# Patient Record
Sex: Female | Born: 1935 | Race: White | Hispanic: No | Smoking: Former smoker
Health system: Southern US, Community
[De-identification: ages and names within clinical notes are randomized; demographics above are authoritative.]

## PROBLEM LIST (undated history)

## (undated) DIAGNOSIS — I499 Cardiac arrhythmia, unspecified: Secondary | ICD-10-CM

## (undated) DIAGNOSIS — M1990 Unspecified osteoarthritis, unspecified site: Secondary | ICD-10-CM

## (undated) DIAGNOSIS — K222 Esophageal obstruction: Secondary | ICD-10-CM

## (undated) DIAGNOSIS — K219 Gastro-esophageal reflux disease without esophagitis: Secondary | ICD-10-CM

## (undated) DIAGNOSIS — K449 Diaphragmatic hernia without obstruction or gangrene: Secondary | ICD-10-CM

## (undated) DIAGNOSIS — E785 Hyperlipidemia, unspecified: Secondary | ICD-10-CM

## (undated) HISTORY — DX: Gastro-esophageal reflux disease without esophagitis: K21.9

## (undated) HISTORY — DX: Diaphragmatic hernia without obstruction or gangrene: K44.9

## (undated) HISTORY — PX: ABDOMINAL HYSTERECTOMY: SHX81

## (undated) HISTORY — PX: EYE SURGERY: SHX253

## (undated) HISTORY — DX: Esophageal obstruction: K22.2

## (undated) HISTORY — DX: Cardiac arrhythmia, unspecified: I49.9

## (undated) HISTORY — DX: Hyperlipidemia, unspecified: E78.5

---

## 2004-10-10 ENCOUNTER — Ambulatory Visit: Payer: Self-pay | Admitting: Gastroenterology

## 2004-10-16 ENCOUNTER — Ambulatory Visit: Payer: Self-pay | Admitting: Gastroenterology

## 2004-10-16 ENCOUNTER — Ambulatory Visit (HOSPITAL_COMMUNITY): Admission: RE | Admit: 2004-10-16 | Discharge: 2004-10-16 | Payer: Self-pay | Admitting: Gastroenterology

## 2005-01-23 ENCOUNTER — Ambulatory Visit: Payer: Self-pay | Admitting: Internal Medicine

## 2006-02-11 ENCOUNTER — Ambulatory Visit: Payer: Self-pay | Admitting: Internal Medicine

## 2007-03-10 ENCOUNTER — Ambulatory Visit: Payer: Self-pay | Admitting: Internal Medicine

## 2008-03-16 ENCOUNTER — Ambulatory Visit: Payer: Self-pay | Admitting: Internal Medicine

## 2009-03-28 ENCOUNTER — Ambulatory Visit: Payer: Self-pay | Admitting: Internal Medicine

## 2010-03-29 ENCOUNTER — Ambulatory Visit: Payer: Self-pay | Admitting: Internal Medicine

## 2011-04-02 ENCOUNTER — Ambulatory Visit: Payer: Self-pay | Admitting: Internal Medicine

## 2012-04-07 ENCOUNTER — Ambulatory Visit: Payer: Self-pay | Admitting: Internal Medicine

## 2012-05-07 ENCOUNTER — Encounter: Payer: Self-pay | Admitting: Gastroenterology

## 2012-05-21 ENCOUNTER — Encounter: Payer: Self-pay | Admitting: Gastroenterology

## 2012-05-21 ENCOUNTER — Ambulatory Visit (INDEPENDENT_AMBULATORY_CARE_PROVIDER_SITE_OTHER): Payer: Medicare Other | Admitting: Gastroenterology

## 2012-05-21 VITALS — BP 138/84 | HR 100 | Ht 65.0 in | Wt 172.0 lb

## 2012-05-21 DIAGNOSIS — K219 Gastro-esophageal reflux disease without esophagitis: Secondary | ICD-10-CM

## 2012-05-21 DIAGNOSIS — R1319 Other dysphagia: Secondary | ICD-10-CM

## 2012-05-21 NOTE — Progress Notes (Signed)
History of Present Illness: This is a 78 year old female with a history of GERD and a peptic stricture. She complains of intermittent dysphagia to solids, particularly breads, for one year. Her symptoms have not been progressive. She underwent upper endoscopy by Dr. Victorino Dike in 1999 with dilation of an esophagogastric junction stricture. She takes omeprazole 20 mg daily which has generally been helpful in controlling her reflux symptoms although she notes frequent breakthrough symptoms and regurgitation with recumbency. She underwent colonoscopy in 2006 which was completed the hepatic flexure and a subsequent air-contrast barium enema was unremarkable. Denies weight loss, abdominal pain, constipation, diarrhea, change in stool caliber, melena, hematochezia, nausea, vomiting, and chest pain.  Review of Systems: Pertinent positive and negative review of systems were noted in the above HPI section. All other review of systems were otherwise negative.  Current Medications, Allergies, Past Medical History, Past Surgical History, Family History and Social History were reviewed in [employer] record.  Physical Exam: General: Well developed , well nourished, no acute distress Head: Normocephalic and atraumatic Eyes:  sclerae anicteric, EOMI Ears: Normal auditory acuity Mouth: No deformity or lesions Neck: Supple, no masses or thyromegaly Lungs: Clear throughout to auscultation Heart: Regular rate and rhythm; no murmurs, rubs or bruits Abdomen: Soft, non tender and non distended. No masses, hepatosplenomegaly or hernias noted. Normal Bowel sounds Musculoskeletal: Symmetrical with no gross deformities  Skin: No lesions on visible extremities Pulses:  Normal pulses noted Extremities: No clubbing, cyanosis, edema or deformities noted Neurological: Alert oriented x 4, grossly nonfocal Cervical Nodes:  No significant cervical adenopathy Inguinal Nodes: No significant inguinal  adenopathy Psychological:  Alert and cooperative. Normal mood and affect  Assessment and Recommendations:  1. Dysphagia and GERD. Presumably she has a recurrent esophageal stricture. Standard antireflux measures and continue omeprazole 20 mg daily. May need to increase omeprazole to 40 mg daily or 20 mg twice a day pending findings at endoscopy. Schedule upper endoscopy. The risks, benefits, and alternatives to endoscopy with possible biopsy and possible dilation were discussed with the patient and they consent to proceed.   2. Colorectal cancer screening, routine risk. She is due for screening colonoscopy in 2016. Given her prior difficulties with colonoscopy consider a virtual colonoscopy or air-contrast barium enema.

## 2012-05-21 NOTE — Patient Instructions (Addendum)
You have been scheduled for an endoscopy with propofol. Please follow written instructions given to you at your visit today. If you use inhalers (even only as needed), please bring them with you on the day of your procedure.  Patient advised to avoid spicy, acidic, citrus, chocolate, mints, fruit and fruit juices.  Limit the intake of caffeine, alcohol and Soda.  Don't exercise too soon after eating.  Don't lie down within 3-4 hours of eating.  Elevate the head of your bed.  CC: Shannon Hendrix

## 2012-06-19 ENCOUNTER — Ambulatory Visit (AMBULATORY_SURGERY_CENTER): Payer: Medicare Other | Admitting: Gastroenterology

## 2012-06-19 ENCOUNTER — Encounter: Payer: Self-pay | Admitting: Gastroenterology

## 2012-06-19 VITALS — BP 131/80 | HR 99 | Temp 98.3°F | Resp 20 | Ht 65.0 in | Wt 172.0 lb

## 2012-06-19 DIAGNOSIS — K219 Gastro-esophageal reflux disease without esophagitis: Secondary | ICD-10-CM

## 2012-06-19 DIAGNOSIS — K222 Esophageal obstruction: Secondary | ICD-10-CM

## 2012-06-19 DIAGNOSIS — R1319 Other dysphagia: Secondary | ICD-10-CM

## 2012-06-19 MED ORDER — SODIUM CHLORIDE 0.9 % IV SOLN: 500.0000 mL | INTRAVENOUS | Status: DC

## 2012-06-19 NOTE — Progress Notes (Addendum)
INFORMED JOHN NULTY CRNA OF SA02 READINGS, STAYING 91-90%. JOHN NULTY CRNA IN TO SEE PATIENT . HEAD OF BED ELEVATED  ENCOURAGED COUGH AND DEEP BREATHING. PATIENT EXPECTORATED SMALL AMOUNT OF WHITE MUCOUS. PATIENT EXAMINED BY JOHN NULTY,CRNA AND DR. STARK, CLEARED FOR DISCHARGE WITH SA02 OF 99%, USING EAR PROBE.

## 2012-06-19 NOTE — Progress Notes (Signed)
Patient did not experience any of the following events: a burn prior to discharge; a fall within the facility; wrong site/side/patient/procedure/implant event; or a hospital transfer or hospital admission upon discharge from the facility. (G8907) Patient did not have preoperative order for IV antibiotic SSI prophylaxis. (G8918)  

## 2012-06-19 NOTE — Progress Notes (Signed)
Called to see patient for decreased (90-92%) SATS Appears comfortable RR 16-18, no c/o SOB skin tone pink, Lungs CTA  , silver nail polish, O2 sat probe place on ear and Sats increased to 95-97% Dr. Russella Dar informed

## 2012-06-19 NOTE — Op Note (Signed)
[facility] [address] [place], [address]   ENDOSCOPY PROCEDURE REPORT  PATIENT: Shannon, Hendrix  MR#: [id number] BIRTHDATE: 11/08/1933 , 77  yrs. old GENDER: Female ENDOSCOPIST: Meryl Dare, MD, [facility] PROCEDURE DATE:  06/19/2012 PROCEDURE:   EGD with dilatation over guidewire ASA CLASS:   Class II INDICATIONS:dysphagia and GERD. MEDICATIONS: MAC sedation, administered by CRNA and propofol (Diprivan) 200mg  IV TOPICAL ANESTHETIC:   Cetacaine Spray DESCRIPTION OF PROCEDURE:   After the risks benefits and alternatives of the procedure were thoroughly explained, informed consent was obtained.  The     endoscope was introduced through the mouth  and advanced to the descending duodenum ,      The instrument was slowly withdrawn as the mucosa was carefully examined.   ESOPHAGUS: A stricture was found at the gastroesophageal junction. The stenosis was traversable with the endoscope and it appeared benign.   The esophagus was otherwise normal. STOMACH: The mucosa and folds of the stomach appeared normal.   A small  hiatal hernia noted on retroflexed view. DUODENUM: The duodenal mucosa showed no abnormalities in the bulb and second portion of the duodenum. Dilation was then performed at the gastroesphageal junction: Dilator:Savary over guidewire Size:15, 16 and 17 mm Reststance:minimal Heme:none  COMPLICATIONS: There were no complications.  ENDOSCOPIC IMPRESSION: 1.   Stricture was found at the gastroesophageal junction 2.   Small hiatal hernia  RECOMMENDATIONS: 1.  anti-reflux regimen long term 2.  continue PPI long term 3.  post dilation instructions   eSigned:  Meryl Dare, MD, [facility] 06/19/2012 3:10 PM

## 2012-06-19 NOTE — Patient Instructions (Signed)
YOU HAD AN ENDOSCOPIC PROCEDURE TODAY AT THE [facility]: Refer to the procedure report that was given to you for any specific questions about what was found during the examination.  If the procedure report does not answer your questions, please call your gastroenterologist to clarify.  If you requested that your care partner not be given the details of your procedure findings, then the procedure report has been included in a sealed envelope for you to review at your convenience later.  YOU SHOULD EXPECT: Some feelings of bloating in the abdomen. Passage of more gas than usual.  Walking can help get rid of the air that was put into your GI tract during the procedure and reduce the bloating. If you had a lower endoscopy (such as a colonoscopy or flexible sigmoidoscopy) you may notice spotting of blood in your stool or on the toilet paper. If you underwent a bowel prep for your procedure, then you may not have a normal bowel movement for a few days.  DIET: NOTHING TO EAT OR DRINK UNTIL4:15 PM. 4:15 TO 5:15 ONLY CLEAR LIQUIDS. AFTER 5:15 SOFT FOODS UNTIL THE AM. RESUME YOUR DIET IN AM. ACTIVITY: Your care partner should take you home directly after the procedure.  You should plan to take it easy, moving slowly for the rest of the day.  You can resume normal activity the day after the procedure however you should NOT DRIVE or use heavy machinery for 24 hours (because of the sedation medicines used during the test).    SYMPTOMS TO REPORT IMMEDIATELY: A gastroenterologist can be reached at any hour.  During normal business hours, 8:30 AM to 5:00 PM Monday through Friday, call [phone].  After hours and on weekends, please call the GI answering service at [phone] who will take a message and have the physician on call contact you.   Following upper endoscopy (EGD)  Vomiting of blood or coffee ground material  New chest pain or pain under the shoulder blades  Painful or persistently  difficult swallowing  New shortness of breath  Fever of 100F or higher  Black, tarry-looking stools  FOLLOW UP: If any biopsies were taken you will be contacted by phone or by letter within the next 1-3 weeks.  Call your gastroenterologist if you have not heard about the biopsies in 3 weeks.  Our staff will call the home number listed on your records the next business day following your procedure to check on you and address any questions or concerns that you may have at that time regarding the information given to you following your procedure. This is a courtesy call and so if there is no answer at the home number and we have not heard from you through the emergency physician on call, we will assume that you have returned to your regular daily activities without incident.  SIGNATURES/CONFIDENTIALITY: You and/or your care partner have signed paperwork which will be entered into your electronic medical record.  These signatures attest to the fact that that the information above on your After Visit Summary has been reviewed and is understood.  Full responsibility of the confidentiality of this discharge information lies with you and/or your care-partner.

## 2012-06-22 ENCOUNTER — Telehealth: Payer: Self-pay | Admitting: *Deleted

## 2012-06-22 NOTE — Telephone Encounter (Signed)
  Follow up Call-  Call back number 06/19/2012  Post procedure Call Back phone  # [phone]  Permission to leave phone message Yes     Patient questions:  Do you have a fever, pain , or abdominal swelling? no Pain Score  0 *  Have you tolerated food without any problems? yes  Have you been able to return to your normal activities? yes  Do you have any questions about your discharge instructions: Diet   no Medications  no Follow up visit  no  Do you have questions or concerns about your Care? no  Actions: * If pain score is 4 or above: No action needed, pain <4.

## 2013-04-08 ENCOUNTER — Ambulatory Visit: Payer: Self-pay | Admitting: Internal Medicine

## 2013-05-04 ENCOUNTER — Encounter: Payer: Self-pay | Admitting: Podiatry

## 2013-05-04 ENCOUNTER — Ambulatory Visit (INDEPENDENT_AMBULATORY_CARE_PROVIDER_SITE_OTHER): Payer: Medicare Other | Admitting: Podiatry

## 2013-05-04 VITALS — BP 140/78 | HR 98 | Temp 97.7°F | Resp 16 | Ht 65.0 in | Wt 169.4 lb

## 2013-05-04 DIAGNOSIS — M722 Plantar fascial fibromatosis: Secondary | ICD-10-CM

## 2013-05-04 NOTE — Patient Instructions (Signed)
Call with any problems

## 2013-05-04 NOTE — Progress Notes (Signed)
Subjective:     Patient ID: Shannon Hendrix, female   DOB: 10-Sep-1933, 80 y.o.   MRN: [id number]  HPI patient states that her heel is feeling better. Patient has not resumed exercise at this time.   Review of Systems  All other systems reviewed and are negative.       Objective:   Physical Exam  Nursing note and vitals reviewed. Cardiovascular: Intact distal pulses.    The patient's left heel pain has reduced quite a bit when pressed. Mild discomfort still noted.    Assessment:     Plantar fasciitis left that is showing significant improvement.    Plan:     Educated patient on physical therapy types of shoe gear is that I think would be better and ice therapy. Patient may resume exercise gradually over the next 4 weeks and is encouraged to continue fascially brace. Reappointed symptoms start again

## 2013-05-14 ENCOUNTER — Encounter: Payer: Self-pay | Admitting: Podiatry

## 2013-05-14 ENCOUNTER — Ambulatory Visit (INDEPENDENT_AMBULATORY_CARE_PROVIDER_SITE_OTHER): Payer: Medicare Other | Admitting: Podiatry

## 2013-05-14 VITALS — BP 139/83 | HR 105 | Resp 16 | Ht 65.0 in | Wt 165.0 lb

## 2013-05-14 DIAGNOSIS — M722 Plantar fascial fibromatosis: Secondary | ICD-10-CM

## 2013-05-14 NOTE — Progress Notes (Signed)
Subjective:     Patient ID: Shannon Hendrix, female   DOB: [birth year], 79 y.o.   MRN: [id number]  HPI patient states I'm still having pain in my left foot but improved. She is walking greater distances but has pain when sitting or in the morning   Review of Systems  All other systems reviewed and are negative.       Objective:   Physical Exam  Nursing note and vitals reviewed. Constitutional: She appears well-developed and well-nourished.  Cardiovascular: Intact distal pulses.   Musculoskeletal: Normal range of motion.  Skin: Skin is warm.   Patient's heel is mildly tender but improved from previous visit patient continues to have discomfort with ambulation and after periods of sitting    Assessment:     Plantar fasciitis improving but still stubbornly present    Plan:     Advised on physical therapy and supportive shoe gear usage. Dispensed a night splint with instructions on usage

## 2013-05-14 NOTE — Patient Instructions (Signed)

## 2014-02-18 DIAGNOSIS — M169 Osteoarthritis of hip, unspecified: Secondary | ICD-10-CM | POA: Insufficient documentation

## 2014-04-11 ENCOUNTER — Ambulatory Visit: Payer: Self-pay | Admitting: Internal Medicine

## 2014-04-12 ENCOUNTER — Ambulatory Visit: Payer: Self-pay | Admitting: Internal Medicine

## 2014-04-18 ENCOUNTER — Ambulatory Visit: Payer: Self-pay | Admitting: Internal Medicine

## 2014-04-18 HISTORY — PX: BREAST BIOPSY: SHX20

## 2014-04-19 LAB — PATHOLOGY REPORT

## 2015-01-04 ENCOUNTER — Other Ambulatory Visit: Payer: Self-pay | Admitting: Internal Medicine

## 2015-01-04 DIAGNOSIS — Z1231 Encounter for screening mammogram for malignant neoplasm of breast: Secondary | ICD-10-CM

## 2015-01-04 DIAGNOSIS — N63 Unspecified lump in unspecified breast: Secondary | ICD-10-CM

## 2015-01-11 ENCOUNTER — Ambulatory Visit
Admission: RE | Admit: 2015-01-11 | Discharge: 2015-01-11 | Disposition: A | Discharge status: Home / Self Care | Payer: Medicare Other | Source: Ambulatory Visit | Attending: Internal Medicine | Admitting: Internal Medicine

## 2015-01-11 ENCOUNTER — Ambulatory Visit: Payer: Medicare Other

## 2015-01-11 DIAGNOSIS — N6489 Other specified disorders of breast: Secondary | ICD-10-CM | POA: Diagnosis not present

## 2015-01-11 DIAGNOSIS — N63 Unspecified lump in unspecified breast: Secondary | ICD-10-CM

## 2015-01-26 ENCOUNTER — Other Ambulatory Visit: Payer: Self-pay | Admitting: Internal Medicine

## 2015-01-26 DIAGNOSIS — D242 Benign neoplasm of left breast: Secondary | ICD-10-CM

## 2015-02-21 ENCOUNTER — Ambulatory Visit: Payer: Medicare Other | Admitting: Podiatry

## 2015-04-13 ENCOUNTER — Ambulatory Visit
Admission: RE | Admit: 2015-04-13 | Discharge: 2015-04-13 | Disposition: A | Discharge status: Home / Self Care | Payer: Medicare Other | Source: Ambulatory Visit | Attending: Internal Medicine | Admitting: Internal Medicine

## 2015-04-13 DIAGNOSIS — D242 Benign neoplasm of left breast: Secondary | ICD-10-CM

## 2015-04-13 DIAGNOSIS — N63 Unspecified lump in breast: Secondary | ICD-10-CM | POA: Diagnosis present

## 2016-01-10 ENCOUNTER — Other Ambulatory Visit: Payer: Self-pay | Admitting: Internal Medicine

## 2016-01-10 DIAGNOSIS — Z1231 Encounter for screening mammogram for malignant neoplasm of breast: Secondary | ICD-10-CM

## 2016-04-15 ENCOUNTER — Ambulatory Visit: Payer: Medicare Other

## 2016-04-17 ENCOUNTER — Other Ambulatory Visit: Payer: Self-pay | Admitting: Internal Medicine

## 2016-04-17 ENCOUNTER — Ambulatory Visit
Admission: RE | Admit: 2016-04-17 | Discharge: 2016-04-17 | Disposition: A | Discharge status: Home / Self Care | Payer: Medicare Other | Source: Ambulatory Visit | Attending: Internal Medicine | Admitting: Internal Medicine

## 2016-04-17 DIAGNOSIS — Z1231 Encounter for screening mammogram for malignant neoplasm of breast: Secondary | ICD-10-CM | POA: Diagnosis not present

## 2017-01-16 ENCOUNTER — Other Ambulatory Visit: Payer: Self-pay | Admitting: Internal Medicine

## 2017-01-16 DIAGNOSIS — Z1231 Encounter for screening mammogram for malignant neoplasm of breast: Secondary | ICD-10-CM

## 2017-01-19 ENCOUNTER — Encounter: Payer: Self-pay | Admitting: Emergency Medicine

## 2017-01-19 ENCOUNTER — Observation Stay
Admission: EM | Admit: 2017-01-19 | Discharge: 2017-01-20 | Disposition: A | Discharge status: Home / Self Care | Payer: Medicare Other | Attending: Internal Medicine | Admitting: Internal Medicine

## 2017-01-19 DIAGNOSIS — K625 Hemorrhage of anus and rectum: Principal | ICD-10-CM | POA: Diagnosis present

## 2017-01-19 DIAGNOSIS — Z7982 Long term (current) use of aspirin: Secondary | ICD-10-CM | POA: Diagnosis not present

## 2017-01-19 DIAGNOSIS — R000 Tachycardia, unspecified: Secondary | ICD-10-CM | POA: Diagnosis not present

## 2017-01-19 DIAGNOSIS — R197 Diarrhea, unspecified: Secondary | ICD-10-CM

## 2017-01-19 DIAGNOSIS — E785 Hyperlipidemia, unspecified: Secondary | ICD-10-CM | POA: Insufficient documentation

## 2017-01-19 DIAGNOSIS — Z79899 Other long term (current) drug therapy: Secondary | ICD-10-CM | POA: Diagnosis not present

## 2017-01-19 DIAGNOSIS — K219 Gastro-esophageal reflux disease without esophagitis: Secondary | ICD-10-CM | POA: Insufficient documentation

## 2017-01-19 DIAGNOSIS — Z87891 Personal history of nicotine dependence: Secondary | ICD-10-CM | POA: Diagnosis not present

## 2017-01-19 LAB — CBC
HCT: 45.4 % (ref 35.0–47.0)
HEMOGLOBIN: 15.6 g/dL (ref 12.0–16.0)
MCH: 30.8 pg (ref 26.0–34.0)
MCHC: 34.4 g/dL (ref 32.0–36.0)
MCV: 89.6 fL (ref 80.0–100.0)
PLATELETS: 237 10*3/uL (ref 150–440)
RBC: 5.07 MIL/uL (ref 3.80–5.20)
RDW: 12.8 % (ref 11.5–14.5)
WBC: 15.5 10*3/uL — AB (ref 3.6–11.0)

## 2017-01-19 LAB — COMPREHENSIVE METABOLIC PANEL
ALK PHOS: 92 U/L (ref 38–126)
ALT: 36 U/L (ref 14–54)
ANION GAP: 7 (ref 5–15)
AST: 38 U/L (ref 15–41)
Albumin: 4.2 g/dL (ref 3.5–5.0)
BUN: 11 mg/dL (ref 6–20)
CALCIUM: 9.3 mg/dL (ref 8.9–10.3)
CO2: 26 mmol/L (ref 22–32)
CREATININE: 1.02 mg/dL — AB (ref 0.44–1.00)
Chloride: 104 mmol/L (ref 101–111)
GFR calc non Af Amer: 51 mL/min — ABNORMAL LOW (ref >60)
GFR, EST AFRICAN AMERICAN: 59 mL/min — AB (ref >60)
Glucose, Bld: 145 mg/dL — ABNORMAL HIGH (ref 65–99)
Potassium: 4.1 mmol/L (ref 3.5–5.1)
SODIUM: 137 mmol/L (ref 135–145)
Total Bilirubin: 0.7 mg/dL (ref 0.3–1.2)
Total Protein: 7.5 g/dL (ref 6.5–8.1)

## 2017-01-19 LAB — TYPE AND SCREEN
ABO/RH(D): O POS
ANTIBODY SCREEN: NEGATIVE

## 2017-01-19 LAB — PROTIME-INR
INR: 0.93
PROTHROMBIN TIME: 12.5 s (ref 11.4–15.2)

## 2017-01-19 LAB — GASTROINTESTINAL PANEL BY PCR, STOOL (REPLACES STOOL CULTURE)

## 2017-01-19 LAB — C DIFFICILE QUICK SCREEN W PCR REFLEX
C DIFFICILE (CDIFF) TOXIN: NEGATIVE
C DIFFICLE (CDIFF) ANTIGEN: NEGATIVE
C Diff interpretation: NOT DETECTED

## 2017-01-19 MED ORDER — PANTOPRAZOLE SODIUM 40 MG PO TBEC
40.0000 mg | DELAYED_RELEASE_TABLET | Freq: Every day | ORAL | Status: DC
  Administered 2017-01-19 – 2017-01-20 (×2): 40 mg via ORAL
  Filled 2017-01-19 (×2): qty 1

## 2017-01-19 MED ORDER — ONDANSETRON HCL 4 MG/2ML IJ SOLN: 4.0000 mg | Freq: Four times a day (QID) | INTRAMUSCULAR | Status: DC | PRN

## 2017-01-19 MED ORDER — OMEGA-3-ACID ETHYL ESTERS 1 G PO CAPS
1.0000 g | ORAL_CAPSULE | Freq: Every day | ORAL | Status: DC
  Administered 2017-01-20: 1 g via ORAL
  Filled 2017-01-19: qty 1

## 2017-01-19 MED ORDER — ONDANSETRON HCL 4 MG PO TABS: 4.0000 mg | ORAL_TABLET | Freq: Four times a day (QID) | ORAL | Status: DC | PRN

## 2017-01-19 MED ORDER — ACETAMINOPHEN 650 MG RE SUPP: 650.0000 mg | Freq: Four times a day (QID) | RECTAL | Status: DC | PRN

## 2017-01-19 MED ORDER — AMITRIPTYLINE HCL 50 MG PO TABS
100.0000 mg | ORAL_TABLET | Freq: Every day | ORAL | Status: DC
  Filled 2017-01-19: qty 1

## 2017-01-19 MED ORDER — AMITRIPTYLINE HCL 50 MG PO TABS
50.0000 mg | ORAL_TABLET | Freq: Every day | ORAL | Status: DC
  Filled 2017-01-19 (×2): qty 1

## 2017-01-19 MED ORDER — ACETAMINOPHEN 325 MG PO TABS: 650.0000 mg | ORAL_TABLET | Freq: Four times a day (QID) | ORAL | Status: DC | PRN

## 2017-01-19 MED ORDER — ADULT MULTIVITAMIN W/MINERALS CH
1.0000 | ORAL_TABLET | Freq: Every day | ORAL | Status: DC
  Administered 2017-01-20: 1 via ORAL
  Filled 2017-01-19 (×2): qty 1

## 2017-01-19 MED ORDER — METOPROLOL TARTRATE 25 MG PO TABS
25.0000 mg | ORAL_TABLET | Freq: Two times a day (BID) | ORAL | Status: DC
  Administered 2017-01-19 – 2017-01-20 (×2): 25 mg via ORAL
  Filled 2017-01-19 (×2): qty 1

## 2017-01-19 MED ORDER — SODIUM CHLORIDE 0.9 % IV BOLUS (SEPSIS)
500.0000 mL | Freq: Once | INTRAVENOUS | Status: AC
Start: 2017-01-19 — End: 2017-01-19
  Administered 2017-01-19: 500 mL via INTRAVENOUS

## 2017-01-19 MED ORDER — PRAVASTATIN SODIUM 20 MG PO TABS
20.0000 mg | ORAL_TABLET | Freq: Every day | ORAL | Status: DC
  Administered 2017-01-19: 20 mg via ORAL
  Filled 2017-01-19: qty 1

## 2017-01-19 MED ORDER — VITAMIN D 1000 UNITS PO TABS
2000.0000 [IU] | ORAL_TABLET | Freq: Every day | ORAL | Status: DC
  Administered 2017-01-20: 2000 [IU] via ORAL
  Filled 2017-01-19: qty 2

## 2017-01-19 NOTE — ED Triage Notes (Signed)
Pt with diarrhea started last night, states this am with bright red blood coming from her rectum x1. No   Pain.

## 2017-01-19 NOTE — ED Notes (Signed)
Pt back in bed, placed back on cardiac monitor, call bell in reach and family at bedside.

## 2017-01-19 NOTE — H&P (Signed)
[place] [facility] [place] NAME: Shannon Hendrix    MR#:  [id number]  DATE OF BIRTH:  [birth year]  DATE OF ADMISSION:  01/19/2017  PRIMARY CARE PHYSICIAN: Adin Hector, MD   REQUESTING/REFERRING PHYSICIAN: Dr Burlene Arnt  CHIEF COMPLAINT:   Past bright red blood per rectum 2 HISTORY OF PRESENT ILLNESS:  Shannon Hendrix  is a 83 y.o. female with a known history of GERD, hyperlipidemia comes to the emergency room after she had bright red blood per rectum 2 since yesterday. Patient said she had started with diarrhea which was brown looking and then had to bowel movements with bright red blood. She quantitates about teaspoonful of blood. Denies any abdominal pain and denies any history of diverticular disease or known history of hemorrhoids. She does have intermittent constipation. She was somewhat tachycardic in the ER. She felt dizzy and hence internal medicine was consulted for rectal bleed and further evaluation  Patient had attempted colonoscopy 2003 were record states her colon was very tortuous and could not do the procedure.  PAST MEDICAL HISTORY:   Past Medical History:  Diagnosis Date  . Cardiac arrhythmia   . Esophageal stricture   . GERD (gastroesophageal reflux disease)   . Hiatal hernia   . HLD (hyperlipidemia)     PAST SURGICAL HISTOIRY:   Past Surgical History:  Procedure Laterality Date  . ABDOMINAL HYSTERECTOMY    . BREAST BIOPSY Left 04/18/14   negative  . EYE SURGERY Bilateral     SOCIAL HISTORY:   Social History  Substance Use Topics  . Smoking status: Former Smoker    Packs/day: 0.30    Years: 5.00    Types: Cigarettes    Quit date: 07/29/1961  . Smokeless tobacco: Never Used  . Alcohol use No    FAMILY HISTORY:   Family History  Problem Relation Age of Onset  . Bone cancer Sister   . Heart attack Brother     DRUG ALLERGIES:  No Known Allergies  REVIEW OF SYSTEMS:  Review of Systems   Constitutional: Negative for chills, fever and weight loss.  HENT: Negative for ear discharge, ear pain and nosebleeds.   Eyes: Negative for blurred vision, pain and discharge.  Respiratory: Negative for sputum production, shortness of breath, wheezing and stridor.   Cardiovascular: Negative for chest pain, palpitations, orthopnea and PND.  Gastrointestinal: Positive for blood in stool. Negative for abdominal pain, diarrhea, nausea and vomiting.  Genitourinary: Negative for frequency and urgency.  Musculoskeletal: Negative for back pain and joint pain.  Neurological: Positive for weakness. Negative for sensory change, speech change and focal weakness.  Psychiatric/Behavioral: Negative for depression and hallucinations. The patient is not nervous/anxious.      MEDICATIONS AT HOME:   Prior to Admission medications   Medication Sig Start Date End Date Taking? Authorizing Provider  amitriptyline (ELAVIL) 100 MG tablet Take 100 mg by mouth at bedtime.    Yes [provider]  aspirin 81 MG tablet Take 81 mg by mouth daily.   Yes [provider]  Cholecalciferol (VITAMIN D-3) 1000 UNITS CAPS Take 2 capsules by mouth daily.    Yes [provider]  lovastatin (MEVACOR) 20 MG tablet Take 20 mg by mouth at bedtime.   Yes [provider]  metoprolol tartrate (LOPRESSOR) 25 MG tablet Take 25 mg by mouth 2 (two) times daily.   Yes [provider]  Multiple Vitamin (MULTIVITAMIN) tablet Take 1 tablet by mouth daily.  Yes [provider]  Omega-3 Fatty Acids (FISH OIL) 1000 MG CAPS Take 2 capsules by mouth 2 (two) times daily.    Yes [provider]  omeprazole (PRILOSEC) 20 MG capsule Take 20 mg by mouth daily.   Yes [provider]      VITAL SIGNS:  Blood pressure 138/79, pulse (!) 119, temperature 98.4 F (36.9 C), temperature source Oral, resp. rate 18, weight 74.8 kg (165 lb), SpO2 94 %.  PHYSICAL EXAMINATION:  GENERAL:   83 y.o.-year-old patient lying in the bed with no acute distress.  EYES: Pupils equal, round, reactive to light and accommodation. No scleral icterus. Extraocular muscles intact.  HEENT: Head atraumatic, normocephalic. Oropharynx and nasopharynx clear.  NECK:  Supple, no jugular venous distention. No thyroid enlargement, no tenderness.  LUNGS: Normal breath sounds bilaterally, no wheezing, rales,rhonchi or crepitation. No use of accessory muscles of respiration.  CARDIOVASCULAR: S1, S2 normal. No murmurs, rubs, or gallops.  ABDOMEN: Soft, nontender, nondistended. Bowel sounds present. No organomegaly or mass.  EXTREMITIES: No pedal edema, cyanosis, or clubbing.  NEUROLOGIC: Cranial nerves II through XII are intact. Muscle strength 5/5 in all extremities. Sensation intact. Gait not checked.  PSYCHIATRIC: The patient is alert and oriented x 3.  SKIN: No obvious rash, lesion, or ulcer.   LABORATORY PANEL:   CBC  Recent Labs Lab 01/19/17 0823  WBC 15.5*  HGB 15.6  HCT 45.4  PLT 237   ------------------------------------------------------------------------------------------------------------------  Chemistries   Recent Labs Lab 01/19/17 0823  NA 137  K 4.1  CL 104  CO2 26  GLUCOSE 145*  BUN 11  CREATININE 1.02*  CALCIUM 9.3  AST 38  ALT 36  ALKPHOS 92  BILITOT 0.7   ------------------------------------------------------------------------------------------------------------------  Cardiac Enzymes No results for input(s): TROPONINI in the last 168 hours. ------------------------------------------------------------------------------------------------------------------  RADIOLOGY:  No results found.  EKG:   Sinus tachycardia IMPRESSION AND PLAN:  Shannon Hendrix  is a 83 y.o. female with a known history of GERD, hyperlipidemia comes to the emergency room after she had bright red blood per rectum 2 since yesterday. Patient said she had started with diarrhea which was  brown looking and then had to bowel movements with bright red blood.  1. Rectal bleed -Suspect internal hemorrhoids versus diverticular -Hemoglobin stable vital stable other than mild tachycardia -Monitor H&H -GI consultation -Continue PPI for GERD -Hold aspirin  2. Mild tachycardia secondary to 1  3. GERD continue PPI  4. Hyperlipidemia continue lovastatin  5. DVT prophylaxis SCD and teds in the setting of rectal bleed  Patient was informed she is being admitted for night observation. Discussed with family in the ER    All the records are reviewed and case discussed with ED provider. Management plans discussed with the patient, family and they are in agreement.  CODE STATUS: *Full*  TOTAL TIME TAKING CARE OF THIS PATIENT: *40* minutes.    Luke Falero M.D on 01/19/2017 at 11:12 AM  Between 7am to 6pm - Pager - [phone]  After 6pm go to [email or web address] - password [email or web address] [facility]  [facility]  Office  [phone]  CC: Primary care physician; Adin Hector, MD

## 2017-01-19 NOTE — ED Notes (Signed)
Rodman Key, RN called to transport pt to the floor

## 2017-01-19 NOTE — Consult Note (Signed)
Consultation  Referring Provider:      Primary Care Physician:  Adin Hector, MD Primary Gastroenterologist:         Reason for Consultation:     Hematochezia     Impression / Plan:   Hematochezia: Probable hemorrhoidal after bout of explosive diarrhea. GI panel negative. Diarrhea resolved. HGB stable. Differential also includes ischemic colitis (elevated WBC) or malignancy. Patient prefers to avoid colonoscopy at this time. If stops bleeding in 24 hrs she can be discharged with follow up consultation with gastroenterologist. If continues bleeding needs colonoscopy on Tuesday.           HPI:   Shannon Hendrix is a 83 y.o. female with a known history of GERD, hyperlipidemia comes to the emergency room after she had bright red blood per rectum 2 since yesterday. Patient said she had started with diarrhea which was brown looking and then had to bowel movements with bright red blood. She quantitates about teaspoonful of blood. Denies any abdominal pain and denies any history of diverticular disease or known history of hemorrhoids. She does have intermittent constipation. Last colonoscopy in 2006. Repeat CBC is pending.  Patient did not want to do bowel prep at this time if could be avoided.   Past Medical History:  Diagnosis Date  . Cardiac arrhythmia   . Esophageal stricture   . GERD (gastroesophageal reflux disease)   . Hiatal hernia   . HLD (hyperlipidemia)     Past Surgical History:  Procedure Laterality Date  . ABDOMINAL HYSTERECTOMY    . BREAST BIOPSY Left 04/18/14   negative  . EYE SURGERY Bilateral     Family History  Problem Relation Age of Onset  . Bone cancer Sister   . Heart attack Brother       Social History  Substance Use Topics  . Smoking status: Former Smoker    Packs/day: 0.30    Years: 5.00    Types: Cigarettes    Quit date: 07/29/1961  . Smokeless tobacco: Never Used  . Alcohol use No    Prior to Admission medications   Medication Sig Start  Date End Date Taking? Authorizing Provider  amitriptyline (ELAVIL) 100 MG tablet Take 100 mg by mouth at bedtime.    Yes [provider]  aspirin 81 MG tablet Take 81 mg by mouth daily.   Yes [provider]  Cholecalciferol (VITAMIN D-3) 1000 UNITS CAPS Take 2 capsules by mouth daily.    Yes [provider]  lovastatin (MEVACOR) 20 MG tablet Take 20 mg by mouth at bedtime.   Yes [provider]  metoprolol tartrate (LOPRESSOR) 25 MG tablet Take 25 mg by mouth 2 (two) times daily.   Yes [provider]  Multiple Vitamin (MULTIVITAMIN) tablet Take 1 tablet by mouth daily.   Yes [provider]  Omega-3 Fatty Acids (FISH OIL) 1000 MG CAPS Take 2 capsules by mouth 2 (two) times daily.    Yes [provider]  omeprazole (PRILOSEC) 20 MG capsule Take 20 mg by mouth daily.   Yes [provider]    Current Facility-Administered Medications  Medication Dose Route Frequency Provider Last Rate Last Dose  . acetaminophen (TYLENOL) tablet 650 mg  650 mg Oral Q6H PRN Fritzi Mandes, MD       Or  . acetaminophen (TYLENOL) suppository 650 mg  650 mg Rectal Q6H PRN Fritzi Mandes, MD      . amitriptyline (ELAVIL) tablet 100 mg  100 mg  Oral Johnna Acosta, MD      . Derrill Memo ON 01/20/2017] cholecalciferol (VITAMIN D) tablet 2,000 Units  2,000 Units Oral Daily Fritzi Mandes, MD      . metoprolol tartrate (LOPRESSOR) tablet 25 mg  25 mg Oral BID Fritzi Mandes, MD      . Derrill Memo ON 01/20/2017] multivitamin with minerals tablet 1 tablet  1 tablet Oral Daily Fritzi Mandes, MD      . Derrill Memo ON 01/20/2017] omega-3 acid ethyl esters (LOVAZA) capsule 1 g  1 g Oral Daily Fritzi Mandes, MD      . ondansetron [facility]) tablet 4 mg  4 mg Oral Q6H PRN Fritzi Mandes, MD       Or  . ondansetron (ZOFRAN) injection 4 mg  4 mg Intravenous Q6H PRN Fritzi Mandes, MD      . pantoprazole (PROTONIX) EC tablet 40 mg  40 mg Oral Daily Fritzi Mandes, MD      . pravastatin (PRAVACHOL)  tablet 20 mg  20 mg Oral [id number] Fritzi Mandes, MD        Allergies as of 01/19/2017  . (No Known Allergies)     Review of Systems:    This is positive for those things mentioned in the HPI,       Physical Exam:  Vital signs in last 24 hours: Temp:  [98.3 F (36.8 C)-98.4 F (36.9 C)] 98.3 F (36.8 C) (06/24 1225) Pulse Rate:  [70-119] 70 (06/24 1225) Resp:  [15-27] 16 (06/24 1225) BP: (123-139)/(61-90) 132/78 (06/24 1225) SpO2:  [93 %-97 %] 97 % (06/24 1225) Weight:  [74.6 kg (164 lb 8 oz)-74.8 kg (165 lb)] 74.6 kg (164 lb 8 oz) (06/24 1225) Last BM Date: 01/19/17  General:  Well-developed, well-nourished and in no acute distress Eyes:  anicteric. ENT:   Mouth and posterior pharynx free of lesions.  Neck:   supple w/o thyromegaly or mass.  Lungs: Clear to auscultation bilaterally. Heart:   S1S2, no rubs, murmurs, gallops. Abdomen:  soft, non-tender, no hepatosplenomegaly, hernia, or mass and BS+.  Rectal: Lymph:  no cervical or supraclavicular adenopathy. Extremities:   no edema Skin   no rash. Neuro:  A&O x 3.  Psych:  appropriate mood and  Affect.   Data Reviewed:   LAB RESULTS:  Recent Labs  01/19/17 0823  WBC 15.5*  HGB 15.6  HCT 45.4  PLT 237   BMET  Recent Labs  01/19/17 0823  NA 137  K 4.1  CL 104  CO2 26  GLUCOSE 145*  BUN 11  CREATININE 1.02*  CALCIUM 9.3   LFT  Recent Labs  01/19/17 0823  PROT 7.5  ALBUMIN 4.2  AST 38  ALT 36  ALKPHOS 92  BILITOT 0.7   PT/INR  Recent Labs  01/19/17 0917  LABPROT 12.5  INR 0.93    STUDIES: No results found.   PREVIOUS ENDOSCOPIES:                Thanks   LOS: 0 days   San Jetty MD @  01/19/2017, 2:27 PM

## 2017-01-19 NOTE — ED Notes (Signed)
Patient ambulatory to the restroom. Pt urinated, blood present on toilet paper from wiping rectum.

## 2017-01-19 NOTE — ED Notes (Signed)
Pt unhooked from cardiac monitor and assisted to bathroom.

## 2017-01-19 NOTE — ED Provider Notes (Signed)
[facility] Emergency Department Provider Note  ____________________________________________   I have reviewed the triage vital signs and the nursing notes.   HISTORY  Chief Complaint Rectal Bleeding and Diarrhea    HPI Shannon Hendrix is a 83 y.o. female who is on aspirin but no other blood thinners, no history of GI bleeding does have a history of recurrent diarrhea has had diarrhea over the last 12 hours but now is having blood. She is having bright blood per rectum. She denies abdominal pain. She does not feel very lightheaded. She denies fever or chills. Denies chest pain or shortness of breath. Patient states her heart rate is always somewhat elevated, it was 118 in triage, at this time it is 105. She did not take her metoprolol swelling. She denies any abdominal pain of any variety. She denies any antibiotics or recent travel. Initially, it was brown non-melanotic diarrhea. She has not vomited. Now dishes bloody diarrhea. She has not had a fever. No recent travel, no known sick contacts with similar, last colonoscopy was 10 years ago, no known history of ever diverticular disease. No history of hemorrhoids and she has no rectal pain. She has had now to these bright red bloody stools.     Past Medical History:  Diagnosis Date  . Cardiac arrhythmia   . Esophageal stricture   . GERD (gastroesophageal reflux disease)   . Hiatal hernia   . HLD (hyperlipidemia)     There are no active problems to display for this patient.   Past Surgical History:  Procedure Laterality Date  . ABDOMINAL HYSTERECTOMY    . BREAST BIOPSY Left 04/18/14   negative  . EYE SURGERY Bilateral     Prior to Admission medications   Medication Sig Start Date End Date Taking? Authorizing Provider  amitriptyline (ELAVIL) 50 MG tablet Take 50 mg by mouth at bedtime.    [provider]  aspirin 81 MG tablet Take 81 mg by mouth daily.    [provider]  atenolol  (TENORMIN) 25 MG tablet Take 25 mg by mouth daily.    [provider]  Cholecalciferol (VITAMIN D-3) 1000 UNITS CAPS Take 2 capsules by mouth daily.     [provider]  lovastatin (MEVACOR) 20 MG tablet Take 20 mg by mouth at bedtime.    [provider]  Omega-3 Fatty Acids (FISH OIL) 1000 MG CAPS Take 2 capsules by mouth 3 (three) times daily.    [provider]  omeprazole (PRILOSEC) 20 MG capsule Take 20 mg by mouth daily.    [provider]    Allergies Patient has no known allergies.  Family History  Problem Relation Age of Onset  . Bone cancer Sister   . Heart attack Brother     Social History Social History  Substance Use Topics  . Smoking status: Former Smoker    Packs/day: 0.30    Years: 5.00    Types: Cigarettes    Quit date: 07/29/1961  . Smokeless tobacco: Never Used  . Alcohol use No    Review of Systems Constitutional: No fever/chills Eyes: No visual changes. ENT: No sore throat. No stiff neck no neck pain Cardiovascular: Denies chest pain. Respiratory: Denies shortness of breath. Gastrointestinal:   no vomiting.  Positive diarrhea.  No constipation. Genitourinary: Negative for dysuria. Musculoskeletal: Negative lower extremity swelling Skin: Negative for rash. Neurological: Negative for severe headaches, focal weakness or numbness.   ____________________________________________   PHYSICAL EXAM:  VITAL SIGNS:  ED Triage Vitals  Enc Vitals Group     BP 01/19/17 0807 138/79     Pulse Rate 01/19/17 0807 (!) 119     Resp 01/19/17 0807 18     Temp 01/19/17 0807 98.4 F (36.9 C)     Temp Source 01/19/17 0807 Oral     SpO2 01/19/17 0807 94 %     Weight 01/19/17 0812 165 lb (74.8 kg)     Height --      Head Circumference --      Peak Flow --      Pain Score --      Pain Loc --      Pain Edu? --      Excl. in [place]? --     Constitutional: Alert and oriented. Well appearing and in no acute distress. Eyes:  Conjunctivae are normal Head: Atraumatic HEENT: No congestion/rhinnorhea. Mucous membranes are moist.  Oropharynx non-erythematous Neck:   Nontender with no meningismus, no masses, no stridor Cardiovascular: Slight tachycardia noted regular rhythm. Grossly normal heart sounds.  Good peripheral circulation. Respiratory: Normal respiratory effort.  No retractions. Lungs CTAB. Abdominal: Soft and nontender. No distention. No guarding no rebound Rectal: Levada Dy, female nurse chaperone present, brown and guaiac positive blood-tinged stool Back:  There is no focal tenderness or step off.  there is no midline tenderness there are no lesions noted. there is no CVA tenderness Musculoskeletal: No lower extremity tenderness, no upper extremity tenderness. No joint effusions, no DVT signs strong distal pulses no edema Neurologic:  Normal speech and language. No gross focal neurologic deficits are appreciated.  Skin:  Skin is warm, dry and intact. No rash noted. Psychiatric: Mood and affect are normal. Speech and behavior are normal.  ____________________________________________   LABS (all labs ordered are listed, but only abnormal results are displayed)  Labs Reviewed  COMPREHENSIVE METABOLIC PANEL - Abnormal; Notable for the following:       Result Value   Glucose, Bld 145 (*)    Creatinine, Ser 1.02 (*)    GFR calc non Af Amer 51 (*)    GFR calc Af Amer 59 (*)    All other components within normal limits  CBC - Abnormal; Notable for the following:    WBC 15.5 (*)    All other components within normal limits  C DIFFICILE QUICK SCREEN W PCR REFLEX  GASTROINTESTINAL PANEL BY PCR, STOOL (REPLACES STOOL CULTURE)  PROTIME-INR  POC OCCULT BLOOD, ED  TYPE AND SCREEN   ____________________________________________  EKG  I personally interpreted any EKGs ordered by me or triage Sinus tachycardia rate 111 bpm no acute ST elevation or depression, no acute ischemic  changes ____________________________________________  RADIOLOGY  I reviewed any imaging ordered by me or triage that were performed during my shift and, if possible, patient and/or family made aware of any abnormal findings. ____________________________________________   PROCEDURES  Procedure(s) performed: None  Procedures  Critical Care performed: CRITICAL CARE Performed by: Schuyler Amor   Total critical care time: 42 minutes  Critical care time was exclusive of separately billable procedures and treating other patients.  Critical care was necessary to treat or prevent imminent or life-threatening deterioration.  Critical care was time spent personally by me on the following activities: development of treatment plan with patient and/or surrogate as well as nursing, discussions with consultants, evaluation of patient's response to treatment, examination of patient, obtaining history from patient or surrogate, ordering and performing treatments and interventions, ordering and review of laboratory  studies, ordering and review of radiographic studies, pulse oximetry and re-evaluation of patient's condition.   ____________________________________________   INITIAL IMPRESSION / ASSESSMENT AND PLAN / ED COURSE  Pertinent labs & imaging results that were available during my care of the patient were reviewed by me and considered in my medical decision making (see chart for details).  Patient here with rectal bleed. She has mild tachycardic this certainly could be multifactorial as she is somewhat anxious as well. However she did have another bloody bowel movement here. She clearly is guaiac positive. Hemoglobin initially is reassuring but that certainly is not sufficient in a patient with aspirin and recurrent red blood from rectum to establish that she is safe to go home. I feel patient would be better served with an admission. Her abdomen is completely benign with no tenderness and  don't think imaging would likely give [place] a diagnosis. We will admit to the hospitalist service.    ____________________________________________   FINAL CLINICAL IMPRESSION(S) / ED DIAGNOSES  Final diagnoses:  Diarrhea, unspecified type  Rectal bleeding      This chart was dictated using voice recognition software.  Despite best efforts to proofread,  errors can occur which can change meaning.      Schuyler Amor, MD 01/19/17 1034

## 2017-01-20 DIAGNOSIS — K625 Hemorrhage of anus and rectum: Secondary | ICD-10-CM

## 2017-01-20 LAB — CBC
HEMATOCRIT: 41.4 % (ref 35.0–47.0)
HEMOGLOBIN: 14.3 g/dL (ref 12.0–16.0)
MCH: 31.4 pg (ref 26.0–34.0)
MCHC: 34.5 g/dL (ref 32.0–36.0)
MCV: 90.9 fL (ref 80.0–100.0)
Platelets: 202 10*3/uL (ref 150–440)
RBC: 4.55 MIL/uL (ref 3.80–5.20)
RDW: 12.9 % (ref 11.5–14.5)
WBC: 8.8 10*3/uL (ref 3.6–11.0)

## 2017-01-20 LAB — HEMOGLOBIN: Hemoglobin: 14.6 g/dL (ref 12.0–16.0)

## 2017-01-20 MED ORDER — PEG 3350-KCL-NA BICARB-NACL 420 G PO SOLR
4000.0000 mL | Freq: Once | ORAL | Status: AC
  Administered 2017-01-20: 4000 mL via ORAL
  Filled 2017-01-20: qty 4000

## 2017-01-20 NOTE — Progress Notes (Signed)
Shannon Bellows MD, MRCP(U.K) [address]  [place]  [place], [place] [address]  Main: [place] is being followed for rectal bleeding  Day 2 of follow up   Subjective: No further bowel movent since yesterday , no diarrhea    Objective: Vital signs in last 24 hours: Vitals:   01/19/17 2046 01/20/17 0529 01/20/17 0536 01/20/17 0938  BP: 121/78 99/67 109/73 (!) 144/82  Pulse: (!) 108 100 (!) 104 (!) 107  Resp: 16 16    Temp: 98.5 F (36.9 C) 98.8 F (37.1 C)    TempSrc: Oral Oral    SpO2: 94% 92% 96% 96%  Weight:      Height:       Weight change:   Intake/Output Summary (Last 24 hours) at 01/20/17 0955 Last data filed at 01/20/17 0908  Gross per 24 hour  Intake              480 ml  Output             1100 ml  Net             -620 ml     Exam: Heart:: Regular rate and rhythm, S1S2 present or without murmur or extra heart sounds Lungs: normal, clear to auscultation and clear to auscultation and percussion Abdomen: soft, nontender, normal bowel sounds   Lab Results: CBC Latest Ref Rng & Units 01/20/2017 01/19/2017  WBC 3.6 - 11.0 K/uL 8.8 15.5(H)  Hemoglobin 12.0 - 16.0 g/dL 14.3 15.6  Hematocrit 35.0 - 47.0 % 41.4 45.4  Platelets 150 - 440 K/uL 202 237   Hepatic Function Latest Ref Rng & Units 01/19/2017  Total Protein 6.5 - 8.1 g/dL 7.5  Albumin 3.5 - 5.0 g/dL 4.2  AST 15 - 41 U/L 38  ALT 14 - 54 U/L 36  Alk Phosphatase 38 - 126 U/L 92  Total Bilirubin 0.3 - 1.2 mg/dL 0.7   BMP Latest Ref Rng & Units 01/19/2017  Glucose 65 - 99 mg/dL 145(H)  BUN 6 - 20 mg/dL 11  Creatinine 0.44 - 1.00 mg/dL 1.02(H)  Sodium 135 - 145 mmol/L 137  Potassium 3.5 - 5.1 mmol/L 4.1  Chloride 101 - 111 mmol/L 104  CO2 22 - 32 mmol/L 26  Calcium 8.9 - 10.3 mg/dL 9.3    Micro Results: Recent Results (from the past 240 hour(s))  Gastrointestinal Panel by PCR , Stool     Status: None   Collection Time: 01/19/17  9:17 AM  Result Value Ref Range  Status   Campylobacter species NOT DETECTED NOT DETECTED Final   Plesimonas shigelloides NOT DETECTED NOT DETECTED Final   Salmonella species NOT DETECTED NOT DETECTED Final   Yersinia enterocolitica NOT DETECTED NOT DETECTED Final   Vibrio species NOT DETECTED NOT DETECTED Final   Vibrio cholerae NOT DETECTED NOT DETECTED Final   Enteroaggregative E coli (EAEC) NOT DETECTED NOT DETECTED Final   Enteropathogenic E coli (EPEC) NOT DETECTED NOT DETECTED Final   Enterotoxigenic E coli (ETEC) NOT DETECTED NOT DETECTED Final   Shiga like toxin producing E coli (STEC) NOT DETECTED NOT DETECTED Final   Shigella/Enteroinvasive E coli (EIEC) NOT DETECTED NOT DETECTED Final   Cryptosporidium NOT DETECTED NOT DETECTED Final   Cyclospora cayetanensis NOT DETECTED NOT DETECTED Final   Entamoeba histolytica NOT DETECTED NOT DETECTED Final   Giardia lamblia NOT DETECTED NOT DETECTED Final   Adenovirus F40/41 NOT DETECTED NOT DETECTED Final  Astrovirus NOT DETECTED NOT DETECTED Final   Norovirus GI/GII NOT DETECTED NOT DETECTED Final   Rotavirus A NOT DETECTED NOT DETECTED Final   Sapovirus (I, II, IV, and V) NOT DETECTED NOT DETECTED Final  C difficile quick scan w PCR reflex     Status: None   Collection Time: 01/19/17  9:17 AM  Result Value Ref Range Status   C Diff antigen NEGATIVE NEGATIVE Final   C Diff toxin NEGATIVE NEGATIVE Final   C Diff interpretation No C. difficile detected.  Final   Studies/Results: No results found. Medications: I have reviewed the patient's current medications. Scheduled Meds: . amitriptyline  50 mg Oral QHS  . cholecalciferol  2,000 Units Oral Daily  . metoprolol tartrate  25 mg Oral BID  . multivitamin with minerals  1 tablet Oral Daily  . omega-3 acid ethyl esters  1 g Oral Daily  . pantoprazole  40 mg Oral Daily  . polyethylene glycol-electrolytes  4,000 mL Oral Once  . pravastatin  20 mg Oral q1800   Continuous Infusions: PRN Meds:.acetaminophen **OR**  acetaminophen, ondansetron **OR** ondansetron (ZOFRAN) IV   Assessment: Active Problems:   Rectal bleed  Shannon Hendrix 83 y.o. female admitted with BRBPR for 1 day duration preceded by diarrhea . No bowel movements since yesterday    Plan: 1. Hb stable- can take a few glasses of golytely and if clear can go home with outpatient follow up if still has blood then plan for colonoscopy tomorrow.     LOS: 0 days   Shannon Hendrix 01/20/2017, 9:55 AM

## 2017-01-20 NOTE — Discharge Summary (Signed)
[facility] NAME: Shannon Hendrix    MR#:  [id number]  DATE OF BIRTH:  [birth year]  DATE OF ADMISSION:  01/19/2017 ADMITTING PHYSICIAN: Fritzi Mandes, MD  DATE OF DISCHARGE: 01/20/2017  PRIMARY CARE PHYSICIAN: Adin Hector, MD    ADMISSION DIAGNOSIS:  Rectal bleeding [K62.5] Diarrhea, unspecified type [R19.7]  DISCHARGE DIAGNOSIS:  Active Problems:   Rectal bleed   SECONDARY DIAGNOSIS:   Past Medical History:  Diagnosis Date  . Cardiac arrhythmia   . Esophageal stricture   . GERD (gastroesophageal reflux disease)   . Hiatal hernia   . HLD (hyperlipidemia)     HOSPITAL COURSE:   1. Rectal bleeding. Patient states she had lots of diarrhea and then started seeing bright red blood dripping out. No pain in the abdomen. When I saw her she had no further bleeding but did not have any further bowel movements. Case discussed with Dr. Vicente Males gastroenterology and we gave her couple cups of GoLYTELY. She did have a bowel movement without blood. Since her hemoglobin was stable and no further bleeding she was discharged home. Stop aspirin. Her last colonoscopy was back in 2006. Most likely the bleeding is rectal irritation versus hemorrhoidal. 2. Tachycardia has a history of this and is on metoprolol 3. GERD on PPI 4. Hyperlipidemia on lovastatin  DISCHARGE CONDITIONS:   Satisfactory  CONSULTS OBTAINED:  Treatment Team:  San Jetty, MD [name], MD  DRUG ALLERGIES:  No Known Allergies  DISCHARGE MEDICATIONS:   Current Discharge Medication List    CONTINUE these medications which have NOT CHANGED   Details  amitriptyline (ELAVIL) 100 MG tablet Take 100 mg by mouth at bedtime.     Cholecalciferol (VITAMIN D-3) 1000 UNITS CAPS Take 2 capsules by mouth daily.     lovastatin (MEVACOR) 20 MG tablet Take 20 mg by mouth at bedtime.    metoprolol tartrate (LOPRESSOR) 25 MG tablet Take 25 mg by mouth 2 (two) times daily.    Multiple Vitamin (MULTIVITAMIN) tablet Take 1 tablet by mouth daily.    Omega-3 Fatty Acids (FISH OIL) 1000 MG CAPS Take 2 capsules by mouth 2 (two) times daily.     omeprazole (PRILOSEC) 20 MG capsule Take 20 mg by mouth daily.      STOP taking these medications     aspirin 81 MG tablet          DISCHARGE INSTRUCTIONS:   Follow-up with PMD one week Follow-up gastroenterology 2 weeks.  If you experience worsening of your admission symptoms, develop shortness of breath, life threatening emergency, suicidal or homicidal thoughts you must seek medical attention immediately by calling 911 or calling your MD immediately  if symptoms less severe.  You Must read complete instructions/literature along with all the possible adverse reactions/side effects for all the Medicines you take and that have been prescribed to you. Take any new Medicines after you have completely understood and accept all the possible adverse reactions/side effects.   Please note  You were cared for by a hospitalist during your hospital stay. If you have any questions about your discharge medications or the care you received while you were in the hospital after you are discharged, you can call the unit and asked to speak with the hospitalist on call if the hospitalist that took care of you is not available. Once you are discharged, your primary care physician will handle any further medical issues. Please note that NO REFILLS for any  discharge medications will be authorized once you are discharged, as it is imperative that you return to your primary care physician (or establish a relationship with a primary care physician if you do not have one) for your aftercare needs so that they can reassess your need for medications and monitor your lab values.    Today   CHIEF COMPLAINT:   Chief Complaint  Patient presents with  . Rectal Bleeding  . Diarrhea    HISTORY OF PRESENT ILLNESS:  Shannon Hendrix  is a 83 y.o.  female presented after diarrhea and had rectal bleeding   VITAL SIGNS:  Blood pressure (!) 146/97, pulse 84, temperature 98.4 F (36.9 C), temperature source Oral, resp. rate 18, height 5\' 5"  (1.651 m), weight 74.6 kg (164 lb 8 oz), SpO2 99 %.    PHYSICAL EXAMINATION:  GENERAL:  83 y.o.-year-old patient lying in the bed with no acute distress.  EYES: Pupils equal, round, reactive to light and accommodation. No scleral icterus. Extraocular muscles intact.  HEENT: Head atraumatic, normocephalic. Oropharynx and nasopharynx clear.  NECK:  Supple, no jugular venous distention. No thyroid enlargement, no tenderness.  LUNGS: Normal breath sounds bilaterally, no wheezing, rales,rhonchi or crepitation. No use of accessory muscles of respiration.  CARDIOVASCULAR: S1, S2 normal. No murmurs, rubs, or gallops.  ABDOMEN: Soft, non-tender, non-distended. Bowel sounds present. No organomegaly or mass.  EXTREMITIES: No pedal edema, cyanosis, or clubbing.  NEUROLOGIC: Cranial nerves II through XII are intact. Muscle strength 5/5 in all extremities. Sensation intact. Gait not checked.  PSYCHIATRIC: The patient is alert and oriented x 3.  SKIN: No obvious rash, lesion, or ulcer.   DATA REVIEW:   CBC  Recent Labs Lab 01/20/17 0513 01/20/17 1139  WBC 8.8  --   HGB 14.3 14.6  HCT 41.4  --   PLT 202  --     Chemistries   Recent Labs Lab 01/19/17 0823  NA 137  K 4.1  CL 104  CO2 26  GLUCOSE 145*  BUN 11  CREATININE 1.02*  CALCIUM 9.3  AST 38  ALT 36  ALKPHOS 92  BILITOT 0.7     Microbiology Results  Results for orders placed or performed during the hospital encounter of 01/19/17  Gastrointestinal Panel by PCR , Stool     Status: None   Collection Time: 01/19/17  9:17 AM  Result Value Ref Range Status   Campylobacter species NOT DETECTED NOT DETECTED Final   Plesimonas shigelloides NOT DETECTED NOT DETECTED Final   Salmonella species NOT DETECTED NOT DETECTED Final   Yersinia  enterocolitica NOT DETECTED NOT DETECTED Final   Vibrio species NOT DETECTED NOT DETECTED Final   Vibrio cholerae NOT DETECTED NOT DETECTED Final   Enteroaggregative E coli (EAEC) NOT DETECTED NOT DETECTED Final   Enteropathogenic E coli (EPEC) NOT DETECTED NOT DETECTED Final   Enterotoxigenic E coli (ETEC) NOT DETECTED NOT DETECTED Final   Shiga like toxin producing E coli (STEC) NOT DETECTED NOT DETECTED Final   Shigella/Enteroinvasive E coli (EIEC) NOT DETECTED NOT DETECTED Final   Cryptosporidium NOT DETECTED NOT DETECTED Final   Cyclospora cayetanensis NOT DETECTED NOT DETECTED Final   Entamoeba histolytica NOT DETECTED NOT DETECTED Final   Giardia lamblia NOT DETECTED NOT DETECTED Final   Adenovirus F40/41 NOT DETECTED NOT DETECTED Final   Astrovirus NOT DETECTED NOT DETECTED Final   Norovirus GI/GII NOT DETECTED NOT DETECTED Final   Rotavirus A NOT DETECTED NOT DETECTED Final   Sapovirus (I, II, IV,  and V) NOT DETECTED NOT DETECTED Final  C difficile quick scan w PCR reflex     Status: None   Collection Time: 01/19/17  9:17 AM  Result Value Ref Range Status   C Diff antigen NEGATIVE NEGATIVE Final   C Diff toxin NEGATIVE NEGATIVE Final   C Diff interpretation No C. difficile detected.  Final      Management plans discussed with the patient, family and they are in agreement.  CODE STATUS:     Code Status Orders        Start     Ordered   01/19/17 1232  Full code  Continuous     01/19/17 1231    Code Status History    Date Active Date Inactive Code Status Order ID Comments User Context   This patient has a current code status but no historical code status.    Advance Directive Documentation     Most Recent Value  Type of Advance Directive  Healthcare Power of Attorney, Living will  Pre-existing out of facility DNR order (yellow form or pink MOST form)  -  "MOST" Form in Place?  -      TOTAL TIME TAKING CARE OF THIS PATIENT: 35 minutes.    Loletha Grayer  M.D on 01/20/2017 at 2:25 PM  Between 7am to 6pm - Pager - [phone]  After 6pm go to [email or web address] - password [email or web address] [facility]  [phone]  CC: Primary care physician; Adin Hector, MD

## 2017-01-20 NOTE — Care Management Obs Status (Signed)
[facility] NOTIFICATION   Patient Details  Name: Shannon Hendrix MRN: [id number] Date of Birth: May 12, 1934   Medicare Observation Status Notification Given:  Yes    Beverly Sessions, RN 01/20/2017, 3:13 PM

## 2017-01-20 NOTE — Discharge Instructions (Signed)
Rectal Bleeding °Rectal bleeding is when blood comes out of the opening of the butt (anus). People with this kind of bleeding may notice bright red blood in their underwear or in the toilet after they poop (have a bowel movement). They may also have dark red or black poop (stool). Rectal bleeding is often a sign that something is wrong. It needs to be checked by a doctor. °Follow these instructions at home: °Watch for any changes in your condition. Take these actions to help with bleeding and discomfort: °· Eat a diet that is high in fiber. This will keep your poop soft so it is easier for you to poop without pushing too hard. Ask your doctor to tell you what foods and drinks are high in fiber. °· Drink enough fluid to keep your pee (urine) clear or pale yellow. This also helps keep your poop soft. °· Try taking a warm bath. This may help with pain. °· Keep all follow-up visits as told by your doctor. This is important. ° °Get help right away if: °· You have new bleeding. °· You have more bleeding than before. °· You have black or dark red poop. °· You throw up (vomit) blood or something that looks like coffee grounds. °· You have pain or tenderness in your belly (abdomen). °· You have a fever. °· You feel weak. °· You feel sick to your stomach (nauseous). °· You pass out (faint). °· You have very bad pain in your butt. °· You cannot poop. °This information is not intended to replace advice given to you by your health care provider. Make sure you discuss any questions you have with your health care provider. °Document Released: 03/27/2011 Document Revised: 12/21/2015 Document Reviewed: 09/10/2015 °Elsevier Interactive Patient Education © 2018 [employer] ° °

## 2017-01-20 NOTE — Progress Notes (Signed)
MD ordered patient to be discharged home.  Discharge instructions were reviewed with the patient and she voiced understanding.  Follow-up appointments were made.  No prescriptions given to the patient.  IV was removed with catheter intact.  All patients questions were answered.  Patient left via wheelchair escorted by NT.

## 2017-03-05 ENCOUNTER — Ambulatory Visit: Payer: Medicare Other | Admitting: Gastroenterology

## 2017-04-18 ENCOUNTER — Ambulatory Visit
Admission: RE | Admit: 2017-04-18 | Discharge: 2017-04-18 | Disposition: A | Discharge status: Home / Self Care | Payer: Medicare Other | Source: Ambulatory Visit | Attending: Internal Medicine | Admitting: Internal Medicine

## 2017-04-18 DIAGNOSIS — Z1231 Encounter for screening mammogram for malignant neoplasm of breast: Secondary | ICD-10-CM

## 2017-04-24 ENCOUNTER — Other Ambulatory Visit: Payer: Self-pay | Admitting: Internal Medicine

## 2017-04-24 DIAGNOSIS — N6489 Other specified disorders of breast: Secondary | ICD-10-CM

## 2017-04-24 DIAGNOSIS — R928 Other abnormal and inconclusive findings on diagnostic imaging of breast: Secondary | ICD-10-CM

## 2017-05-01 ENCOUNTER — Ambulatory Visit
Admission: RE | Admit: 2017-05-01 | Discharge: 2017-05-01 | Disposition: A | Discharge status: Home / Self Care | Payer: Medicare Other | Source: Ambulatory Visit | Attending: Internal Medicine | Admitting: Internal Medicine

## 2017-05-01 DIAGNOSIS — R928 Other abnormal and inconclusive findings on diagnostic imaging of breast: Secondary | ICD-10-CM

## 2017-05-01 DIAGNOSIS — N6489 Other specified disorders of breast: Secondary | ICD-10-CM | POA: Insufficient documentation

## 2017-05-01 DIAGNOSIS — N6321 Unspecified lump in the left breast, upper outer quadrant: Secondary | ICD-10-CM | POA: Insufficient documentation

## 2017-06-04 ENCOUNTER — Other Ambulatory Visit: Payer: Self-pay | Admitting: Internal Medicine

## 2017-06-04 DIAGNOSIS — N6002 Solitary cyst of left breast: Secondary | ICD-10-CM

## 2017-07-30 ENCOUNTER — Encounter
Admission: RE | Admit: 2017-07-30 | Discharge: 2017-07-30 | Disposition: A | Discharge status: Home / Self Care | Payer: Medicare Other | Source: Ambulatory Visit | Attending: Orthopedic Surgery | Admitting: Orthopedic Surgery

## 2017-07-30 ENCOUNTER — Other Ambulatory Visit: Payer: Self-pay

## 2017-07-30 DIAGNOSIS — Z01812 Encounter for preprocedural laboratory examination: Secondary | ICD-10-CM | POA: Diagnosis not present

## 2017-07-30 HISTORY — DX: Unspecified osteoarthritis, unspecified site: M19.90

## 2017-07-30 HISTORY — DX: Cardiac arrhythmia, unspecified: I49.9

## 2017-07-30 LAB — TYPE AND SCREEN
ABO/RH(D): O POS
ANTIBODY SCREEN: NEGATIVE

## 2017-07-30 LAB — PROTIME-INR
INR: 0.94
Prothrombin Time: 12.5 seconds (ref 11.4–15.2)

## 2017-07-30 LAB — SURGICAL PCR SCREEN
MRSA, PCR: NEGATIVE
Staphylococcus aureus: POSITIVE — AB

## 2017-07-30 LAB — URINALYSIS, ROUTINE W REFLEX MICROSCOPIC
BILIRUBIN URINE: NEGATIVE
GLUCOSE, UA: NEGATIVE mg/dL
HGB URINE DIPSTICK: NEGATIVE
Ketones, ur: NEGATIVE mg/dL
Leukocytes, UA: NEGATIVE
Nitrite: NEGATIVE
Protein, ur: NEGATIVE mg/dL
SPECIFIC GRAVITY, URINE: 1.016 (ref 1.005–1.030)
pH: 6 (ref 5.0–8.0)

## 2017-07-30 LAB — C-REACTIVE PROTEIN: CRP: 1.3 mg/dL — AB (ref <1.0)

## 2017-07-30 LAB — SEDIMENTATION RATE: Sed Rate: 8 mm/hr (ref 0–30)

## 2017-07-30 LAB — APTT: aPTT: 29 seconds (ref 24–36)

## 2017-07-30 MED ORDER — CELECOXIB 400 MG PO CAPS: 400.0000 mg | ORAL_CAPSULE | Freq: Once | ORAL | Status: DC

## 2017-07-30 NOTE — Patient Instructions (Signed)
Your procedure is scheduled on: 08/11/17 mon Report to Same Day Surgery 2nd floor medical mall [facility] Entrance-take elevator on left to 2nd floor.  Check in with surgery information desk.) To find out your arrival time please call [phone] between 1PM - 3PM on 08/08/17 Fri  Remember: Instructions that are not followed completely may result in serious medical risk, up to and including death, or upon the discretion of your surgeon and anesthesiologist your surgery may need to be rescheduled.    _x___ 1. Do not eat food after midnight the night before your procedure. You may drink clear liquids up to 2 hours before you are scheduled to arrive at the hospital for your procedure.  Do not drink clear liquids within 2 hours of your scheduled arrival to the hospital.  Clear liquids include  --Water or Apple juice without pulp  --Clear carbohydrate beverage such as ClearFast or Gatorade  --Black Coffee or Clear Tea (No milk, no creamers, do not add anything to                  the coffee or Tea Type 1 and type 2 diabetics should only drink water.  No gum chewing or hard candies.     __x__ 2. No Alcohol for 24 hours before or after surgery.   __x__3. No Smoking for 24 prior to surgery.   ____  4. Bring all medications with you on the day of surgery if instructed.    __x__ 5. Notify your doctor if there is any change in your medical condition     (cold, fever, infections).     Do not wear jewelry, make-up, hairpins, clips or nail polish.  Do not wear lotions, powders, or perfumes. You may wear deodorant.  Do not shave 48 hours prior to surgery. Men may shave face and neck.  Do not bring valuables to the hospital.    [facility] is not responsible for any belongings or valuables.               Contacts, dentures or bridgework may not be worn into surgery.  Leave your suitcase in the car. After surgery it may be brought to your room.  For patients admitted to the hospital, discharge  time is determined by your                       treatment team.   Patients discharged the day of surgery will not be allowed to drive home.  You will need someone to drive you home and stay with you the night of your procedure.    Please read over the following fact sheets that you were given:   [facility] Preparing for Surgery and or MRSA Information   _x___ Take anti-hypertensive listed below, cardiac, seizure, asthma,     anti-reflux and psychiatric medicines. These include:  1. metoprolol tartrate (LOPRESSOR) 25 MG tablet  2.omeprazole (PRILOSEC) 20 MG capsule  3.  4.  5.  6.  ____Fleets enema or Magnesium Citrate as directed.   _x___ Use CHG Soap or sage wipes as directed on instruction sheet   ____ Use inhalers on the day of surgery and bring to hospital day of surgery  ____ Stop Metformin and Janumet 2 days prior to surgery.    ____ Take 1/2 of usual insulin dose the night before surgery and none on the morning     surgery.   _x___ Follow recommendations from Cardiologist, Pulmonologist or PCP  regarding          stopping Aspirin, Coumadin, Plavix ,Eliquis, Effient, or Pradaxa, and Pletal.  X____Stop Anti-inflammatories such as Advil, Aleve, Ibuprofen, Motrin, Naproxen, Naprosyn, Goodies powders or aspirin products. OK to take Tylenol and                          Celebrex.   _x___ Stop supplements until after surgery.  But may continue Vitamin D, Vitamin B,       and multivitamin.   ____ Bring C-Pap to the hospital.

## 2017-07-31 LAB — URINE CULTURE
CULTURE: NO GROWTH
SPECIAL REQUESTS: NORMAL

## 2017-07-31 NOTE — Pre-Procedure Instructions (Signed)
Crp/pos staph faxed to dr Marry Guan

## 2017-08-10 MED ORDER — CEFAZOLIN SODIUM-DEXTROSE 2-4 GM/100ML-% IV SOLN: 2.0000 g | INTRAVENOUS | Status: AC

## 2017-08-10 MED ORDER — TRANEXAMIC ACID 1000 MG/10ML IV SOLN
1000.0000 mg | INTRAVENOUS | Status: AC
  Filled 2017-08-10: qty 10

## 2017-08-11 ENCOUNTER — Encounter: Payer: Self-pay | Admitting: Orthopedic Surgery

## 2017-08-11 DIAGNOSIS — E785 Hyperlipidemia, unspecified: Secondary | ICD-10-CM | POA: Insufficient documentation

## 2017-08-11 DIAGNOSIS — M810 Age-related osteoporosis without current pathological fracture: Secondary | ICD-10-CM | POA: Insufficient documentation

## 2017-08-11 DIAGNOSIS — K219 Gastro-esophageal reflux disease without esophagitis: Secondary | ICD-10-CM | POA: Insufficient documentation

## 2017-08-11 DIAGNOSIS — L409 Psoriasis, unspecified: Secondary | ICD-10-CM | POA: Insufficient documentation

## 2017-08-11 DIAGNOSIS — R739 Hyperglycemia, unspecified: Secondary | ICD-10-CM | POA: Insufficient documentation

## 2017-08-11 NOTE — Discharge Instructions (Signed)
Instructions after Total Hip Replacement ° ° °  Karstyn Birkey P. Dyer Klug, Jr., M.D.    ° Dept. of Orthopaedics & Sports Medicine ° [facility] ° [address] ° [place], [place]  [address] ° Phone: [phone]   Fax: [phone] ° °  °DIET: °• Drink plenty of non-alcoholic fluids. °• Resume your normal diet. Include foods high in fiber. ° °ACTIVITY:  °• You may use crutches or a walker with weight-bearing as tolerated, unless instructed otherwise. °• You may be weaned off of the walker or crutches by your Physical Therapist.  °• Do NOT reach below the level of your knees or cross your legs until allowed.    °• Continue doing gentle exercises. Exercising will reduce the pain and swelling, increase motion, and prevent muscle weakness.   °• Please continue to use the TED compression stockings for 6 weeks. You may remove the stockings at night, but should reapply them in the morning. °• Do not drive or operate any equipment until instructed. ° °WOUND CARE:  °• Continue to use ice packs periodically to reduce pain and swelling. °• Keep the incision clean and dry. °• You may bathe or shower after the staples are removed at the first office visit following surgery. ° °MEDICATIONS: °• You may resume your regular medications. °• Please take the pain medication as prescribed on the medication. °• Do not take pain medication on an empty stomach. °• You have been given a prescription for a blood thinner to prevent blood clots. Please take the medication as instructed. (NOTE: After completing a 2 week course of Lovenox, take one Enteric-coated aspirin once a day.) °• Pain medications and iron supplements can cause constipation. Use a stool softener (Senokot or Colace) on a daily basis and a laxative (dulcolax or miralax) as needed. °• Do not drive or drink alcoholic beverages when taking pain medications. ° °CALL THE OFFICE FOR: °• Temperature above 101 degrees °• Excessive bleeding or drainage on the dressing. °• Excessive  swelling, coldness, or paleness of the toes. °• Persistent nausea and vomiting. ° °FOLLOW-UP:  °• You should have an appointment to return to the office in 6 weeks after surgery. °• Arrangements have been made for continuation of Physical Therapy (either home therapy or outpatient therapy). °  °

## 2017-09-23 MED ORDER — CEFAZOLIN SODIUM-DEXTROSE 2-4 GM/100ML-% IV SOLN
2.0000 g | Freq: Once | INTRAVENOUS | Status: AC
  Administered 2017-09-24: 2 g via INTRAVENOUS

## 2017-09-23 MED ORDER — TRANEXAMIC ACID 1000 MG/10ML IV SOLN
1000.0000 mg | INTRAVENOUS | Status: AC
  Administered 2017-09-24: 1000 mg via INTRAVENOUS
  Filled 2017-09-23: qty 10

## 2017-09-24 ENCOUNTER — Encounter
Admission: RE | Disposition: A | Discharge status: Home Health | Payer: Self-pay | Source: Ambulatory Visit | Attending: Orthopedic Surgery

## 2017-09-24 ENCOUNTER — Inpatient Hospital Stay: Payer: Medicare Other | Admitting: Certified Registered Nurse Anesthetist

## 2017-09-24 ENCOUNTER — Encounter: Payer: Self-pay | Admitting: Orthopedic Surgery

## 2017-09-24 ENCOUNTER — Inpatient Hospital Stay: Payer: Medicare Other

## 2017-09-24 ENCOUNTER — Inpatient Hospital Stay
Admission: RE | Admit: 2017-09-24 | Discharge: 2017-09-27 | DRG: 470 | Disposition: A | Discharge status: Home Health | Payer: Medicare Other | Source: Ambulatory Visit | Attending: Orthopedic Surgery | Admitting: Orthopedic Surgery

## 2017-09-24 ENCOUNTER — Other Ambulatory Visit: Payer: Self-pay

## 2017-09-24 DIAGNOSIS — M1611 Unilateral primary osteoarthritis, right hip: Principal | ICD-10-CM | POA: Diagnosis present

## 2017-09-24 DIAGNOSIS — I491 Atrial premature depolarization: Secondary | ICD-10-CM | POA: Diagnosis present

## 2017-09-24 DIAGNOSIS — I9581 Postprocedural hypotension: Secondary | ICD-10-CM | POA: Diagnosis not present

## 2017-09-24 DIAGNOSIS — M810 Age-related osteoporosis without current pathological fracture: Secondary | ICD-10-CM | POA: Diagnosis present

## 2017-09-24 DIAGNOSIS — Z90710 Acquired absence of both cervix and uterus: Secondary | ICD-10-CM | POA: Diagnosis not present

## 2017-09-24 DIAGNOSIS — Z888 Allergy status to other drugs, medicaments and biological substances status: Secondary | ICD-10-CM | POA: Diagnosis not present

## 2017-09-24 DIAGNOSIS — K449 Diaphragmatic hernia without obstruction or gangrene: Secondary | ICD-10-CM | POA: Diagnosis present

## 2017-09-24 DIAGNOSIS — R000 Tachycardia, unspecified: Secondary | ICD-10-CM | POA: Diagnosis present

## 2017-09-24 DIAGNOSIS — Z9841 Cataract extraction status, right eye: Secondary | ICD-10-CM | POA: Diagnosis not present

## 2017-09-24 DIAGNOSIS — E785 Hyperlipidemia, unspecified: Secondary | ICD-10-CM | POA: Diagnosis present

## 2017-09-24 DIAGNOSIS — Z79899 Other long term (current) drug therapy: Secondary | ICD-10-CM | POA: Diagnosis not present

## 2017-09-24 DIAGNOSIS — Z96649 Presence of unspecified artificial hip joint: Secondary | ICD-10-CM

## 2017-09-24 DIAGNOSIS — Z87891 Personal history of nicotine dependence: Secondary | ICD-10-CM | POA: Diagnosis not present

## 2017-09-24 DIAGNOSIS — Z9842 Cataract extraction status, left eye: Secondary | ICD-10-CM

## 2017-09-24 DIAGNOSIS — M25761 Osteophyte, right knee: Secondary | ICD-10-CM | POA: Diagnosis present

## 2017-09-24 DIAGNOSIS — K219 Gastro-esophageal reflux disease without esophagitis: Secondary | ICD-10-CM | POA: Diagnosis present

## 2017-09-24 HISTORY — PX: TOTAL HIP ARTHROPLASTY: SHX124

## 2017-09-24 LAB — TYPE AND SCREEN
ABO/RH(D): O POS
ANTIBODY SCREEN: NEGATIVE

## 2017-09-24 SURGERY — ARTHROPLASTY, HIP, TOTAL,POSTERIOR APPROACH
Anesthesia: General | Laterality: Right

## 2017-09-24 MED ORDER — MORPHINE SULFATE (PF) 4 MG/ML IV SOLN
2.0000 mg | INTRAVENOUS | Status: DC | PRN
  Administered 2017-09-24: 2 mg via INTRAVENOUS
  Filled 2017-09-24: qty 1

## 2017-09-24 MED ORDER — EPHEDRINE SULFATE 50 MG/ML IJ SOLN
INTRAMUSCULAR | Status: DC | PRN
  Administered 2017-09-24 (×4): 5 mg via INTRAVENOUS

## 2017-09-24 MED ORDER — ACETAMINOPHEN 325 MG PO TABS: 650.0000 mg | ORAL_TABLET | ORAL | Status: DC | PRN

## 2017-09-24 MED ORDER — SODIUM CHLORIDE 0.9 % IV SOLN
INTRAVENOUS | Status: DC
  Administered 2017-09-24 – 2017-09-25 (×3): via INTRAVENOUS

## 2017-09-24 MED ORDER — PROPOFOL 500 MG/50ML IV EMUL
INTRAVENOUS | Status: AC
  Filled 2017-09-24: qty 50

## 2017-09-24 MED ORDER — SODIUM CHLORIDE 0.9 % IV SOLN
0.0000 ug/min | INTRAVENOUS | Status: DC
  Administered 2017-09-24: 10 ug/min via INTRAVENOUS
  Filled 2017-09-24: qty 10

## 2017-09-24 MED ORDER — TRANEXAMIC ACID 1000 MG/10ML IV SOLN
1000.0000 mg | Freq: Once | INTRAVENOUS | Status: AC
  Administered 2017-09-24: 1000 mg via INTRAVENOUS
  Filled 2017-09-24: qty 10

## 2017-09-24 MED ORDER — FERROUS SULFATE 325 (65 FE) MG PO TABS
325.0000 mg | ORAL_TABLET | Freq: Two times a day (BID) | ORAL | Status: DC
  Administered 2017-09-24 – 2017-09-27 (×6): 325 mg via ORAL
  Filled 2017-09-24 (×6): qty 1

## 2017-09-24 MED ORDER — MAGNESIUM HYDROXIDE 400 MG/5ML PO SUSP
30.0000 mL | Freq: Every day | ORAL | Status: DC | PRN
  Administered 2017-09-25 – 2017-09-26 (×2): 30 mL via ORAL
  Filled 2017-09-24 (×2): qty 30

## 2017-09-24 MED ORDER — GABAPENTIN 300 MG PO CAPS
ORAL_CAPSULE | ORAL | Status: AC
  Administered 2017-09-24: 300 mg via ORAL
  Filled 2017-09-24: qty 1

## 2017-09-24 MED ORDER — NEOMYCIN-POLYMYXIN B GU 40-200000 IR SOLN
Status: DC | PRN
  Administered 2017-09-24: 16 mL

## 2017-09-24 MED ORDER — TRAMADOL HCL 50 MG PO TABS
50.0000 mg | ORAL_TABLET | ORAL | Status: DC | PRN
  Administered 2017-09-24: 50 mg via ORAL
  Filled 2017-09-24: qty 1

## 2017-09-24 MED ORDER — CELECOXIB 200 MG PO CAPS
400.0000 mg | ORAL_CAPSULE | Freq: Once | ORAL | Status: AC
  Administered 2017-09-24: 400 mg via ORAL

## 2017-09-24 MED ORDER — MENTHOL 3 MG MT LOZG
1.0000 | LOZENGE | OROMUCOSAL | Status: DC | PRN
  Filled 2017-09-24: qty 9

## 2017-09-24 MED ORDER — SENNOSIDES-DOCUSATE SODIUM 8.6-50 MG PO TABS
1.0000 | ORAL_TABLET | Freq: Two times a day (BID) | ORAL | Status: DC
  Administered 2017-09-24 – 2017-09-26 (×5): 1 via ORAL
  Filled 2017-09-24 (×5): qty 1

## 2017-09-24 MED ORDER — SODIUM CHLORIDE FLUSH 0.9 % IV SOLN
INTRAVENOUS | Status: AC
  Filled 2017-09-24: qty 10

## 2017-09-24 MED ORDER — METOPROLOL TARTRATE 25 MG PO TABS
25.0000 mg | ORAL_TABLET | Freq: Two times a day (BID) | ORAL | Status: DC
  Administered 2017-09-25 – 2017-09-27 (×5): 25 mg via ORAL
  Filled 2017-09-24 (×7): qty 1

## 2017-09-24 MED ORDER — PHENYLEPHRINE HCL 10 MG/ML IJ SOLN
INTRAMUSCULAR | Status: DC | PRN
  Administered 2017-09-24 (×2): 100 ug via INTRAVENOUS

## 2017-09-24 MED ORDER — OXYCODONE HCL 5 MG PO TABS
5.0000 mg | ORAL_TABLET | ORAL | Status: DC | PRN
  Administered 2017-09-26 – 2017-09-27 (×3): 5 mg via ORAL
  Filled 2017-09-24 (×3): qty 1

## 2017-09-24 MED ORDER — PANTOPRAZOLE SODIUM 40 MG PO TBEC
40.0000 mg | DELAYED_RELEASE_TABLET | Freq: Two times a day (BID) | ORAL | Status: DC
  Administered 2017-09-24 – 2017-09-27 (×6): 40 mg via ORAL
  Filled 2017-09-24 (×6): qty 1

## 2017-09-24 MED ORDER — BUPIVACAINE HCL (PF) 0.5 % IJ SOLN
INTRAMUSCULAR | Status: AC
  Filled 2017-09-24: qty 10

## 2017-09-24 MED ORDER — PHENOL 1.4 % MT LIQD
1.0000 | OROMUCOSAL | Status: DC | PRN
  Filled 2017-09-24: qty 177

## 2017-09-24 MED ORDER — CHLORHEXIDINE GLUCONATE 4 % EX LIQD: 60.0000 mL | Freq: Once | CUTANEOUS | Status: DC

## 2017-09-24 MED ORDER — PROPOFOL 10 MG/ML IV BOLUS
INTRAVENOUS | Status: DC | PRN
  Administered 2017-09-24 (×3): 14 mg via INTRAVENOUS

## 2017-09-24 MED ORDER — BUPIVACAINE HCL (PF) 0.5 % IJ SOLN
INTRAMUSCULAR | Status: DC | PRN
  Administered 2017-09-24: 3 mL

## 2017-09-24 MED ORDER — FENTANYL CITRATE (PF) 100 MCG/2ML IJ SOLN
25.0000 ug | INTRAMUSCULAR | Status: DC | PRN
  Administered 2017-09-24 (×3): 25 ug via INTRAVENOUS

## 2017-09-24 MED ORDER — FENTANYL CITRATE (PF) 100 MCG/2ML IJ SOLN
INTRAMUSCULAR | Status: AC
  Administered 2017-09-24: 25 ug via INTRAVENOUS
  Filled 2017-09-24: qty 2

## 2017-09-24 MED ORDER — LACTATED RINGERS IV SOLN
INTRAVENOUS | Status: DC
  Administered 2017-09-24 (×2): via INTRAVENOUS

## 2017-09-24 MED ORDER — NEOMYCIN-POLYMYXIN B GU 40-200000 IR SOLN
Status: AC
  Filled 2017-09-24: qty 20

## 2017-09-24 MED ORDER — SODIUM CHLORIDE 0.9 % IV SOLN
INTRAVENOUS | Status: DC | PRN
  Administered 2017-09-24: 30 ug/min via INTRAVENOUS

## 2017-09-24 MED ORDER — CEFAZOLIN SODIUM-DEXTROSE 2-4 GM/100ML-% IV SOLN
2.0000 g | Freq: Four times a day (QID) | INTRAVENOUS | Status: AC
  Administered 2017-09-24 – 2017-09-25 (×4): 2 g via INTRAVENOUS
  Filled 2017-09-24 (×3): qty 100

## 2017-09-24 MED ORDER — AMITRIPTYLINE HCL 50 MG PO TABS
50.0000 mg | ORAL_TABLET | Freq: Every day | ORAL | Status: DC
  Administered 2017-09-24 – 2017-09-26 (×3): 50 mg via ORAL
  Filled 2017-09-24: qty 1
  Filled 2017-09-24 (×2): qty 2
  Filled 2017-09-24 (×2): qty 1
  Filled 2017-09-24: qty 2

## 2017-09-24 MED ORDER — VASOPRESSIN 20 UNIT/ML IV SOLN
INTRAVENOUS | Status: DC | PRN
  Administered 2017-09-24: 3 [IU] via INTRAVENOUS

## 2017-09-24 MED ORDER — CELECOXIB 200 MG PO CAPS
ORAL_CAPSULE | ORAL | Status: AC
  Administered 2017-09-24: 400 mg via ORAL
  Filled 2017-09-24: qty 2

## 2017-09-24 MED ORDER — CEFAZOLIN SODIUM-DEXTROSE 2-4 GM/100ML-% IV SOLN
INTRAVENOUS | Status: AC
  Filled 2017-09-24: qty 100

## 2017-09-24 MED ORDER — LIDOCAINE HCL (PF) 2 % IJ SOLN
INTRAMUSCULAR | Status: AC
  Filled 2017-09-24: qty 10

## 2017-09-24 MED ORDER — GABAPENTIN 300 MG PO CAPS
300.0000 mg | ORAL_CAPSULE | Freq: Every day | ORAL | Status: DC
  Administered 2017-09-24 – 2017-09-26 (×3): 300 mg via ORAL
  Filled 2017-09-24 (×3): qty 1

## 2017-09-24 MED ORDER — PROPOFOL 500 MG/50ML IV EMUL
INTRAVENOUS | Status: DC | PRN
  Administered 2017-09-24: 50 ug/kg/min via INTRAVENOUS

## 2017-09-24 MED ORDER — ALUM & MAG HYDROXIDE-SIMETH 200-200-20 MG/5ML PO SUSP: 30.0000 mL | ORAL | Status: DC | PRN

## 2017-09-24 MED ORDER — OXYCODONE HCL 5 MG PO TABS: 10.0000 mg | ORAL_TABLET | ORAL | Status: DC | PRN

## 2017-09-24 MED ORDER — DEXAMETHASONE SODIUM PHOSPHATE 10 MG/ML IJ SOLN
INTRAMUSCULAR | Status: AC
  Administered 2017-09-24: 8 mg via INTRAVENOUS
  Filled 2017-09-24: qty 1

## 2017-09-24 MED ORDER — ENOXAPARIN SODIUM 30 MG/0.3ML ~~LOC~~ SOLN
30.0000 mg | Freq: Two times a day (BID) | SUBCUTANEOUS | Status: DC
  Administered 2017-09-24 – 2017-09-27 (×6): 30 mg via SUBCUTANEOUS
  Filled 2017-09-24 (×6): qty 0.3

## 2017-09-24 MED ORDER — OMEGA-3-ACID ETHYL ESTERS 1 G PO CAPS
1.0000 g | ORAL_CAPSULE | Freq: Two times a day (BID) | ORAL | Status: DC
  Administered 2017-09-24 – 2017-09-27 (×7): 1 g via ORAL
  Filled 2017-09-24 (×7): qty 1

## 2017-09-24 MED ORDER — CEFAZOLIN SODIUM-DEXTROSE 2-4 GM/100ML-% IV SOLN
INTRAVENOUS | Status: AC
Start: 2017-09-24 — End: 2017-09-24
  Administered 2017-09-24: 2 g via INTRAVENOUS
  Filled 2017-09-24: qty 100

## 2017-09-24 MED ORDER — FLEET ENEMA 7-19 GM/118ML RE ENEM: 1.0000 | ENEMA | Freq: Once | RECTAL | Status: DC | PRN

## 2017-09-24 MED ORDER — DEXAMETHASONE SODIUM PHOSPHATE 10 MG/ML IJ SOLN
8.0000 mg | Freq: Once | INTRAMUSCULAR | Status: AC
  Administered 2017-09-24: 8 mg via INTRAVENOUS

## 2017-09-24 MED ORDER — DIPHENHYDRAMINE HCL 12.5 MG/5ML PO ELIX: 12.5000 mg | ORAL_SOLUTION | ORAL | Status: DC | PRN

## 2017-09-24 MED ORDER — FENTANYL CITRATE (PF) 100 MCG/2ML IJ SOLN
INTRAMUSCULAR | Status: DC | PRN
  Administered 2017-09-24: 100 ug via INTRAVENOUS

## 2017-09-24 MED ORDER — ACETAMINOPHEN 650 MG RE SUPP
650.0000 mg | RECTAL | Status: DC | PRN
  Filled 2017-09-24: qty 1

## 2017-09-24 MED ORDER — METOCLOPRAMIDE HCL 10 MG PO TABS
10.0000 mg | ORAL_TABLET | Freq: Three times a day (TID) | ORAL | Status: AC
  Administered 2017-09-24 – 2017-09-26 (×8): 10 mg via ORAL
  Filled 2017-09-24 (×8): qty 1

## 2017-09-24 MED ORDER — KETAMINE HCL 50 MG/ML IJ SOLN
INTRAMUSCULAR | Status: AC
  Filled 2017-09-24: qty 10

## 2017-09-24 MED ORDER — BISACODYL 10 MG RE SUPP: 10.0000 mg | Freq: Every day | RECTAL | Status: DC | PRN

## 2017-09-24 MED ORDER — ACETAMINOPHEN 10 MG/ML IV SOLN
1000.0000 mg | Freq: Four times a day (QID) | INTRAVENOUS | Status: AC
  Administered 2017-09-24 – 2017-09-25 (×3): 1000 mg via INTRAVENOUS
  Filled 2017-09-24 (×4): qty 100

## 2017-09-24 MED ORDER — GABAPENTIN 300 MG PO CAPS
300.0000 mg | ORAL_CAPSULE | Freq: Once | ORAL | Status: AC
  Administered 2017-09-24: 300 mg via ORAL

## 2017-09-24 MED ORDER — FENTANYL CITRATE (PF) 100 MCG/2ML IJ SOLN
INTRAMUSCULAR | Status: AC
  Filled 2017-09-24: qty 2

## 2017-09-24 MED ORDER — ACETAMINOPHEN 10 MG/ML IV SOLN
INTRAVENOUS | Status: DC | PRN
  Administered 2017-09-24: 1000 mg via INTRAVENOUS

## 2017-09-24 MED ORDER — ONDANSETRON HCL 4 MG PO TABS: 4.0000 mg | ORAL_TABLET | Freq: Four times a day (QID) | ORAL | Status: DC | PRN

## 2017-09-24 MED ORDER — VITAMIN B-12 1000 MCG PO TABS
1000.0000 ug | ORAL_TABLET | Freq: Every day | ORAL | Status: DC
  Administered 2017-09-24 – 2017-09-27 (×4): 1000 ug via ORAL
  Filled 2017-09-24 (×4): qty 1

## 2017-09-24 MED ORDER — ACETAMINOPHEN 10 MG/ML IV SOLN
INTRAVENOUS | Status: AC
  Filled 2017-09-24: qty 100

## 2017-09-24 MED ORDER — EPINEPHRINE PF 1 MG/10ML IJ SOSY
PREFILLED_SYRINGE | INTRAMUSCULAR | Status: DC | PRN
  Administered 2017-09-24: 5 ug via INTRAVENOUS

## 2017-09-24 MED ORDER — ONDANSETRON HCL 4 MG/2ML IJ SOLN
4.0000 mg | Freq: Four times a day (QID) | INTRAMUSCULAR | Status: DC | PRN
  Administered 2017-09-24: 4 mg via INTRAVENOUS
  Filled 2017-09-24: qty 2

## 2017-09-24 MED ORDER — CELECOXIB 200 MG PO CAPS
200.0000 mg | ORAL_CAPSULE | Freq: Two times a day (BID) | ORAL | Status: DC
  Administered 2017-09-25 – 2017-09-27 (×5): 200 mg via ORAL
  Filled 2017-09-24 (×6): qty 1

## 2017-09-24 MED ORDER — PRAVASTATIN SODIUM 20 MG PO TABS
20.0000 mg | ORAL_TABLET | Freq: Every day | ORAL | Status: DC
  Administered 2017-09-24 – 2017-09-26 (×3): 20 mg via ORAL
  Filled 2017-09-24 (×3): qty 1

## 2017-09-24 MED ORDER — VITAMIN D3 25 MCG (1000 UNIT) PO TABS
5000.0000 [IU] | ORAL_TABLET | Freq: Every day | ORAL | Status: DC
  Administered 2017-09-24 – 2017-09-27 (×4): 5000 [IU] via ORAL
  Filled 2017-09-24 (×8): qty 5

## 2017-09-24 MED ORDER — KETAMINE HCL 50 MG/ML IJ SOLN
INTRAMUSCULAR | Status: DC | PRN
  Administered 2017-09-24: 35 mg via INTRAMUSCULAR

## 2017-09-24 MED ORDER — ADULT MULTIVITAMIN W/MINERALS CH
1.0000 | ORAL_TABLET | Freq: Every day | ORAL | Status: DC
  Administered 2017-09-24 – 2017-09-27 (×4): 1 via ORAL
  Filled 2017-09-24 (×4): qty 1

## 2017-09-24 MED ORDER — ONDANSETRON HCL 4 MG/2ML IJ SOLN: 4.0000 mg | Freq: Once | INTRAMUSCULAR | Status: DC | PRN

## 2017-09-24 SURGICAL SUPPLY — 54 items
BLADE DRUM FLTD (BLADE) ×3 IMPLANT
BLADE SAW 1 (BLADE) ×3 IMPLANT
CANISTER SUCT 1200ML W/VALVE (MISCELLANEOUS) ×3 IMPLANT
CANISTER SUCT 3000ML PPV (MISCELLANEOUS) ×6 IMPLANT
CAPT HIP TOTAL 2 ×2 IMPLANT
CARTRIDGE OIL MAESTRO DRILL (MISCELLANEOUS) ×1 IMPLANT
DIFFUSER DRILL AIR PNEUMATIC (MISCELLANEOUS) ×3 IMPLANT
DRAPE INCISE IOBAN 66X60 STRL (DRAPES) ×3 IMPLANT
DRAPE SHEET LG 3/4 BI-LAMINATE (DRAPES) ×3 IMPLANT
DRSG DERMACEA 8X12 NADH (GAUZE/BANDAGES/DRESSINGS) ×3 IMPLANT
DRSG OPSITE POSTOP 4X12 (GAUZE/BANDAGES/DRESSINGS) ×1 IMPLANT
DRSG OPSITE POSTOP 4X14 (GAUZE/BANDAGES/DRESSINGS) ×2 IMPLANT
DRSG TEGADERM 4X4.75 (GAUZE/BANDAGES/DRESSINGS) ×3 IMPLANT
DURAPREP 26ML APPLICATOR (WOUND CARE) ×3 IMPLANT
ELECT BLADE 6.5 EXT (BLADE) ×3 IMPLANT
ELECT CAUTERY BLADE 6.4 (BLADE) ×3 IMPLANT
EVACUATOR 1/8 PVC DRAIN (DRAIN) ×3 IMPLANT
GLOVE BIOGEL M STRL SZ7.5 (GLOVE) ×10 IMPLANT
GLOVE BIOGEL PI IND STRL 9 (GLOVE) ×1 IMPLANT
GLOVE BIOGEL PI INDICATOR 9 (GLOVE) ×2
GLOVE INDICATOR 8.0 STRL GRN (GLOVE) ×9 IMPLANT
GLOVE SURG SYN 9.0  PF PI (GLOVE) ×2
GLOVE SURG SYN 9.0 PF PI (GLOVE) ×1 IMPLANT
GOWN STRL REUS W/ TWL LRG LVL3 (GOWN DISPOSABLE) ×2 IMPLANT
GOWN STRL REUS W/TWL 2XL LVL3 (GOWN DISPOSABLE) ×3 IMPLANT
GOWN STRL REUS W/TWL LRG LVL3 (GOWN DISPOSABLE) ×6
HOLDER FOLEY CATH W/STRAP (MISCELLANEOUS) ×3 IMPLANT
HOOD PEEL AWAY FLYTE STAYCOOL (MISCELLANEOUS) ×6 IMPLANT
KIT TURNOVER KIT A (KITS) ×3 IMPLANT
NDL SAFETY ECLIPSE 18X1.5 (NEEDLE) ×1 IMPLANT
NEEDLE HYPO 18GX1.5 SHARP (NEEDLE) ×3
NS IRRIG 500ML POUR BTL (IV SOLUTION) ×3 IMPLANT
OIL CARTRIDGE MAESTRO DRILL (MISCELLANEOUS) ×3
PACK HIP PROSTHESIS (MISCELLANEOUS) ×3 IMPLANT
PIN STEIN THRED 5/32 (Pin) ×3 IMPLANT
PULSAVAC PLUS IRRIG FAN TIP (DISPOSABLE) ×3
SOL .9 NS 3000ML IRR  AL (IV SOLUTION) ×2
SOL .9 NS 3000ML IRR AL (IV SOLUTION) ×1
SOL .9 NS 3000ML IRR UROMATIC (IV SOLUTION) ×1 IMPLANT
SOL PREP PVP 2OZ (MISCELLANEOUS) ×3
SOLUTION PREP PVP 2OZ (MISCELLANEOUS) ×1 IMPLANT
SPONGE DRAIN TRACH 4X4 STRL 2S (GAUZE/BANDAGES/DRESSINGS) ×3 IMPLANT
STAPLER SKIN PROX 35W (STAPLE) ×3 IMPLANT
SUT ETHIBOND #5 BRAIDED 30INL (SUTURE) ×3 IMPLANT
SUT VIC AB 0 CT1 36 (SUTURE) ×3 IMPLANT
SUT VIC AB 1 CT1 36 (SUTURE) ×6 IMPLANT
SUT VIC AB 2-0 CT1 27 (SUTURE) ×3
SUT VIC AB 2-0 CT1 TAPERPNT 27 (SUTURE) ×1 IMPLANT
SYR 20CC LL (SYRINGE) ×3 IMPLANT
TAPE ADH 3 LX (MISCELLANEOUS) ×3 IMPLANT
TAPE TRANSPORE STRL 2 31045 (GAUZE/BANDAGES/DRESSINGS) ×3 IMPLANT
TIP FAN IRRIG PULSAVAC PLUS (DISPOSABLE) ×1 IMPLANT
TOWEL OR 17X26 4PK STRL BLUE (TOWEL DISPOSABLE) ×3 IMPLANT
TRAY FOLEY W/METER SILVER 16FR (SET/KITS/TRAYS/PACK) ×3 IMPLANT

## 2017-09-24 NOTE — Anesthesia Procedure Notes (Signed)
Spinal  Patient location during procedure: OR Start time: 09/24/2017 7:18 AM End time: 09/24/2017 7:34 AM Staffing Performed: resident/CRNA  Preanesthetic Checklist Completed: patient identified, site marked, surgical consent, pre-op evaluation, timeout performed, IV checked, risks and benefits discussed and monitors and equipment checked Spinal Block Patient position: sitting Prep: ChloraPrep Patient monitoring: heart rate, continuous pulse ox, blood pressure and cardiac monitor Approach: left paramedian Location: L3-4 Injection technique: single-shot Needle Needle type: Whitacre and Introducer  Needle gauge: 22 G Needle length: 9 cm Additional Notes Negative paresthesia. Negative blood return. Positive free-flowing CSF. Expiration date of kit checked and confirmed. Patient tolerated procedure well, without complications.       

## 2017-09-24 NOTE — Transfer of Care (Signed)
Immediate Anesthesia Transfer of Care Note  Patient: Shannon Hendrix  Procedure(s) Performed: TOTAL HIP ARTHROPLASTY (Right )  Patient Location: PACU  Anesthesia Type:Spinal  Level of Consciousness: sedated  Airway & Oxygen Therapy: Patient Spontanous Breathing and Patient connected to nasal cannula oxygen  Post-op Assessment: Report given to RN and Post -op Vital signs reviewed and stable  Post vital signs: Reviewed and stable  Last Vitals:  Vitals:   09/24/17 0625  BP: (!) 163/71  Resp: 18  Temp: (!) 36.2 C  SpO2: 99%    Last Pain:  Vitals:   09/24/17 0625  TempSrc: Oral         Complications: No apparent anesthesia complications

## 2017-09-24 NOTE — Anesthesia Post-op Follow-up Note (Signed)
Anesthesia QCDR form completed.        

## 2017-09-24 NOTE — Anesthesia Postprocedure Evaluation (Signed)
Anesthesia Post Note  Patient: Shannon Hendrix  Procedure(s) Performed: TOTAL HIP ARTHROPLASTY (Right )  Patient location during evaluation: PACU Anesthesia Type: Spinal Level of consciousness: awake and alert Pain management: pain level controlled Vital Signs Assessment: post-procedure vital signs reviewed and stable Respiratory status: spontaneous breathing, nonlabored ventilation, respiratory function stable and patient connected to nasal cannula oxygen Cardiovascular status: stable and blood pressure returned to baseline Postop Assessment: no apparent nausea or vomiting Anesthetic complications: no     Last Vitals:  Vitals:   09/24/17 1215 09/24/17 1225  BP: (!) 94/52 (!) 98/58  Pulse: 95 96  Resp: 11 15  Temp:    SpO2: 100% 100%    Last Pain:  Vitals:   09/24/17 1125  TempSrc:   PainSc: 4                  Molli Barrows

## 2017-09-24 NOTE — Op Note (Signed)
OPERATIVE NOTE  DATE OF SURGERY:  09/24/2017  PATIENT NAME:  Shannon Hendrix   DOB: 1934-07-14  MRN: [id number]  PRE-OPERATIVE DIAGNOSIS: Degenerative arthrosis of the right hip, primary  POST-OPERATIVE DIAGNOSIS:  Same  PROCEDURE:  Right total hip arthroplasty  SURGEON:  Marciano Sequin. M.D.  ASSISTANT:  Vance Peper, PA (present and scrubbed throughout the case, critical for assistance with exposure, retraction, instrumentation, and closure)  ANESTHESIA: spinal  ESTIMATED BLOOD LOSS: 100 mL  FLUIDS REPLACED: 1300 mL of crystalloid  DRAINS: 2 medium drains to a Hemovac reservoir  IMPLANTS UTILIZED: DePuy 13.5 mm small stature AML femoral stem, 52 mm OD Pinnacle 100 acetabular component, +4 mm neutral Pinnacle Marathon polyethylene insert, and a 36 mm M-SPEC +1.5 mm hip ball  INDICATIONS FOR SURGERY: Shannon Hendrix is a 84 y.o. year old female with a long history of progressive hip and groin  pain. X-rays demonstrated severe degenerative changes. The patient had not seen any significant improvement despite conservative nonsurgical intervention. After discussion of the risks and benefits of surgical intervention, the patient expressed understanding of the risks benefits and agree with plans for total hip arthroplasty.   The risks, benefits, and alternatives were discussed at length including but not limited to the risks of infection, bleeding, nerve injury, stiffness, blood clots, the need for revision surgery, limb length inequality, dislocation, cardiopulmonary complications, among others, and they were willing to proceed.  PROCEDURE IN DETAIL: The patient was brought into the operating room and, after adequate spinal anesthesia was achieved, the patient was placed in a left lateral decubitus position. Axillary roll was placed and all bony prominences were well-padded. The patient's right hip was cleaned and prepped with alcohol and DuraPrep and draped in the usual sterile fashion.  A "timeout" was performed as per usual protocol. A lateral curvilinear incision was made gently curving towards the posterior superior iliac spine. The IT band was incised in line with the skin incision and the fibers of the gluteus maximus were split in line. The piriformis tendon was identified, skeletonized, and incised at its insertion to the proximal femur and reflected posteriorly. A T type posterior capsulotomy was performed. Prior to dislocation of the femoral head, a threaded Steinmann pin was inserted through a separate stab incision into the pelvis superior to the acetabulum and bent in the form of a stylus so as to assess limb length and hip offset throughout the procedure. The femoral head was then dislocated posteriorly. Inspection of the femoral head demonstrated severe degenerative changes with full-thickness loss of articular cartilage. The femoral neck cut was performed using an oscillating saw. The anterior capsule was elevated off of the femoral neck using a periosteal elevator. Attention was then directed to the acetabulum. The remnant of the labrum was excised using electrocautery. Inspection of the acetabulum also demonstrated significant degenerative changes. The acetabulum was reamed in sequential fashion up to a 51 mm diameter. Good punctate bleeding bone was encountered. A 52 mm Pinnacle 100 acetabular component was positioned and impacted into place. Good scratch fit was appreciated. A +4 mm neutral polyethylene trial was inserted.  Attention was then directed to the proximal femur. A hole for reaming of the proximal femoral canal was created using a high-speed burr. The femoral canal was reamed in sequential fashion up to a 13 mm diameter. This allowed for approximately 7 cm of scratch fit.  It was thus elected to ream up to a 13.5 mm diameter to allow for a line to  line fit.  Serial broaches were inserted up to a 13.5 mm small stature femoral broach. Calcar region was planed and a  trial reduction was performed using a 36 mm hip ball with a +1.5 mm neck length. Good equalization of limb lengths and hip offset was appreciated and excellent stability was noted both anteriorly and posteriorly. Trial components were removed. The acetabular shell was irrigated with copious amounts of normal saline with antibiotic solution and suctioned dry. A +4 mm neutral Pinnacle Marathon polyethylene insert was positioned and impacted into place. Next, a 13.5 mm small stature AML femoral stem was positioned and impacted into place. Excellent scratch fit was appreciated. A trial reduction was again performed with a 36 mm hip ball with a +1.5 mm neck length. Again, good equalization of limb lengths was appreciated and excellent stability appreciated both anteriorly and posteriorly. The hip was then dislocated and the trial hip ball was removed. The Morse taper was cleaned and dried. A 36 mm M-SPEC hip ball with a +1.5 mm neck length was placed on the trunnion and impacted into place. The hip was then reduced and placed through range of motion. Excellent stability was appreciated both anteriorly and posteriorly.  The wound was irrigated with copious amounts of normal saline with antibiotic solution and suctioned dry. Good hemostasis was appreciated. The posterior capsulotomy was repaired using #5 Ethibond. Piriformis tendon was reapproximated to the undersurface of the gluteus medius tendon using #5 Ethibond. Two medium drains were placed in the wound bed and brought out through separate stab incisions to be attached to a Hemovac reservoir. The IT band was reapproximated using interrupted sutures of #1 Vicryl. Subcutaneous tissue was approximated using first #0 Vicryl followed by #2-0 Vicryl. The skin was closed with skin staples.  The patient tolerated the procedure well and was transported to the recovery room in stable condition.   Marciano Sequin., M.D.

## 2017-09-24 NOTE — H&P (Signed)
The patient has been re-examined, and the chart reviewed, and there have been no interval changes to the documented history and physical.    The risks, benefits, and alternatives have been discussed at length. The patient expressed understanding of the risks benefits and agreed with plans for surgical intervention.  Anandi Abramo P. Toshika Parrow, Jr. M.D.    

## 2017-09-24 NOTE — NC FL2 (Signed)
[place] LEVEL OF CARE SCREENING TOOL     IDENTIFICATION  Patient Name: Shannon Hendrix Birthdate: May 29, 1934 Sex: female Admission Date (Current Location): 09/24/2017  [place] and [place] Number:  Engineering geologist and Address:  [facility], [address] [place], [place] [address]      Provider Number: [id number]  Attending Physician Name and Address:  Dereck Leep, MD  Relative Name and Phone Number:       Current Level of Care: Hospital Recommended Level of Care: [place] Prior Approval Number:    Date Approved/Denied:   PASRR Number: ([id number])  Discharge Plan: SNF    Current Diagnoses: Patient Active Problem List   Diagnosis Date Noted  . Status post total replacement of hip 09/24/2017  . Psoriasis, unspecified 08/11/2017  . Osteoporosis, post-menopausal 08/11/2017  . GERD (gastroesophageal reflux disease) 08/11/2017  . Hyperglycemia, unspecified 08/11/2017  . Hyperlipidemia, unspecified 08/11/2017  . Rectal bleed 01/19/2017  . OA (osteoarthritis) of hip 02/18/2014    Orientation RESPIRATION BLADDER Height & Weight     Self, Time, Situation, Place  O2(2 Liters Oxygen. ) Continent Weight:   Height:     BEHAVIORAL SYMPTOMS/MOOD NEUROLOGICAL BOWEL NUTRITION STATUS      Continent Diet(Regular Diet. )  AMBULATORY STATUS COMMUNICATION OF NEEDS Skin   Extensive Assist Verbally Surgical wounds(Incision: Right Hip. )                       Personal Care Assistance Level of Assistance  Bathing, Feeding, Dressing Bathing Assistance: Limited assistance Feeding assistance: Independent Dressing Assistance: Limited assistance     Functional Limitations Info  Sight, Hearing, Speech Sight Info: Adequate Hearing Info: Adequate Speech Info: Adequate    SPECIAL CARE FACTORS FREQUENCY  PT (By licensed PT), OT (By licensed OT)     PT Frequency: (5) OT Frequency: (5)             Contractures      Additional Factors Info  Code Status, Allergies Code Status Info: (Full Code. ) Allergies Info: (Nitrofurantoin)           Current Medications (09/24/2017):  This is the current hospital active medication list Current Facility-Administered Medications  Medication Dose Route Frequency Provider Last Rate Last Dose  . 0.9 %  sodium chloride infusion   Intravenous Continuous Hooten, Laurice Record, MD 100 mL/hr at 09/24/17 1632    . acetaminophen (OFIRMEV) IV 1,000 mg  1,000 mg Intravenous Q6H Hooten, Laurice Record, MD   Stopped at 09/24/17 1647  . [START ON 09/25/2017] acetaminophen (TYLENOL) tablet 650 mg  650 mg Oral Q4H PRN Hooten, Laurice Record, MD       Or  . Derrill Memo ON 09/25/2017] acetaminophen (TYLENOL) suppository 650 mg  650 mg Rectal Q4H PRN Hooten, Laurice Record, MD      . alum & mag hydroxide-simeth (MAALOX/MYLANTA) 200-200-20 MG/5ML suspension 30 mL  30 mL Oral Q4H PRN Hooten, Laurice Record, MD      . amitriptyline (ELAVIL) tablet 50 mg  50 mg Oral QHS Hooten, Laurice Record, MD      . bisacodyl (DULCOLAX) suppository 10 mg  10 mg Rectal Daily PRN Hooten, Laurice Record, MD      . ceFAZolin (ANCEF) IVPB 2g/100 mL premix  2 g Intravenous Q6H Hooten, Laurice Record, MD 200 mL/hr at 09/24/17 1337 2 g at 09/24/17 1337  . celecoxib (CELEBREX) capsule 200 mg  200 mg Oral Q12H Hooten, Jeneen Rinks  P, MD      . cholecalciferol (VITAMIN D) tablet 5,000 Units  5,000 Units Oral Daily Hooten, Laurice Record, MD   5,000 Units at 09/24/17 1632  . diphenhydrAMINE (BENADRYL) 12.5 MG/5ML elixir 12.5-25 mg  12.5-25 mg Oral Q4H PRN Hooten, Laurice Record, MD      . enoxaparin (LOVENOX) injection 30 mg  30 mg Subcutaneous Q12H Hooten, Laurice Record, MD      . ferrous sulfate tablet 325 mg  325 mg Oral BID WC Hooten, Laurice Record, MD   325 mg at 09/24/17 1642  . gabapentin (NEURONTIN) capsule 300 mg  300 mg Oral QHS Hooten, Laurice Record, MD      . magnesium hydroxide (MILK OF MAGNESIA) suspension 30 mL  30 mL Oral Daily PRN Hooten, Laurice Record, MD      .  menthol-cetylpyridinium (CEPACOL) lozenge 3 mg  1 lozenge Oral PRN Hooten, Laurice Record, MD       Or  . phenol (CHLORASEPTIC) mouth spray 1 spray  1 spray Mouth/Throat PRN Hooten, Laurice Record, MD      . metoCLOPramide (REGLAN) tablet 10 mg  10 mg Oral TID AC & HS Hooten, Laurice Record, MD   10 mg at 09/24/17 1642  . metoprolol tartrate (LOPRESSOR) tablet 25 mg  25 mg Oral BID Hooten, Laurice Record, MD      . morphine 4 MG/ML injection 2 mg  2 mg Intravenous Q2H PRN Hooten, Laurice Record, MD   2 mg at 09/24/17 1536  . multivitamin with minerals tablet 1 tablet  1 tablet Oral Daily Hooten, Laurice Record, MD   1 tablet at 09/24/17 1632  . omega-3 acid ethyl esters (LOVAZA) capsule 1 g  1 g Oral BID Hooten, Laurice Record, MD   1 g at 09/24/17 1639  . ondansetron (ZOFRAN) tablet 4 mg  4 mg Oral Q6H PRN Hooten, Laurice Record, MD       Or  . ondansetron (ZOFRAN) injection 4 mg  4 mg Intravenous Q6H PRN Hooten, Laurice Record, MD      . oxyCODONE (Oxy IR/ROXICODONE) immediate release tablet 10 mg  10 mg Oral Q3H PRN Hooten, Laurice Record, MD      . oxyCODONE (Oxy IR/ROXICODONE) immediate release tablet 5 mg  5 mg Oral Q3H PRN Hooten, Laurice Record, MD      . pantoprazole (PROTONIX) EC tablet 40 mg  40 mg Oral BID Hooten, Laurice Record, MD      . pravastatin (PRAVACHOL) tablet 20 mg  20 mg Oral q1800 Hooten, Laurice Record, MD      . senna-docusate (Senokot-S) tablet 1 tablet  1 tablet Oral BID Hooten, Laurice Record, MD      . sodium chloride flush 0.9 % injection           . sodium phosphate (FLEET) 7-19 GM/118ML enema 1 enema  1 enema Rectal Once PRN Hooten, Laurice Record, MD      . traMADol Veatrice Bourbon) tablet 50-100 mg  50-100 mg Oral Q4H PRN Hooten, Laurice Record, MD      . vitamin B-12 (CYANOCOBALAMIN) tablet 1,000 mcg  1,000 mcg Oral Daily Hooten, Laurice Record, MD   1,000 mcg at 09/24/17 1633     Discharge Medications: Please see discharge summary for a list of discharge medications.  Relevant Imaging Results:  Relevant Lab Results:   Additional Information (SSN: [id number])  Serai Tukes,  Veronia Beets, LCSW

## 2017-09-24 NOTE — Evaluation (Signed)
Physical Therapy Evaluation Patient Details Name: Shannon Hendrix MRN: [id number] DOB: Apr 27, 1934 Today's Date: 09/24/2017   History of Present Illness  Pt is an 84 y.o. female s/p R THA posterior approach 09/24/17.  Pt seen for PT eval on POD #0.  PMH includes hiatal hernia and cardiac arrhythmia.  Clinical Impression  Prior to hospital admission, pt was independent.  Pt lives alone in 1 level home (level entry).  Currently pt is min to mod assist supine to sit, min assist to stand with RW, and CGA to ambulate a few feet with RW.  Pain 2-3/10 R hip beginning of session and 4/10 end of session.  Pt requiring vc's for posterior hip precautions.  Pt would benefit from skilled PT to address noted impairments and functional limitations (see below for any additional details).  Upon hospital discharge, recommend pt discharge to home with HHPT and support of family.    Follow Up Recommendations Home health PT    Equipment Recommendations  Rolling walker with 5" wheels;3in1 (PT)    Recommendations for Other Services OT consult     Precautions / Restrictions Precautions Precautions: Fall;Posterior Hip Precaution Booklet Issued: Yes (comment) Restrictions Weight Bearing Restrictions: Yes RLE Weight Bearing: Weight bearing as tolerated      Mobility  Bed Mobility Overal bed mobility: Needs Assistance Bed Mobility: Supine to Sit     Supine to sit: Min assist;Mod assist;HOB elevated     General bed mobility comments: vc's for bridging and scooting hips to L side of bed; assist for R LE and trunk supine to sit  Transfers Overall transfer level: Needs assistance Equipment used: Rolling walker (2 wheeled) Transfers: Sit to/from Stand Sit to Stand: Min assist         General transfer comment: vc's for UE and LE positioning and R posterior hip precautions; assist to initiate stand  Ambulation/Gait Ambulation/Gait assistance: Min guard Ambulation Distance (Feet): 3 Feet(bed to  recliner) Assistive device: Rolling walker (2 wheeled) Gait Pattern/deviations: Step-to pattern;Antalgic Gait velocity: decreased   General Gait Details: decreased stance time R LE; vc's to increase UE support through RW to offweight R LE to advance L LE; vc's for posterior hip precautions  Stairs            Wheelchair Mobility    Modified Rankin (Stroke Patients Only)       Balance Overall balance assessment: Needs assistance Sitting-balance support: No upper extremity supported;Feet supported Sitting balance-Leahy Scale: Good Sitting balance - Comments: steady sitting reaching within BOS   Standing balance support: Bilateral upper extremity supported Standing balance-Leahy Scale: Poor Standing balance comment: requires B UE support for static standing balance                             Pertinent Vitals/Pain Pain Assessment: 0-10 Pain Score: 4  Pain Location: R hip Pain Descriptors / Indicators: Sore;Tender Pain Intervention(s): Limited activity within patient's tolerance;Monitored during session;Premedicated before session;Repositioned;Ice applied  HR 111-114 bpm and O2 >96% on room air during session.  BP 99/63 beginning of session in bed and 97/55 end of session resting in chair.    Home Living Family/patient expects to be discharged to:: Private residence Living Arrangements: Alone Available Help at Discharge: Family Type of Home: House Home Access: Level entry     [place]: One [place]: Bedside commode;Walker - 2 wheels      Prior Function Level of Independence: Independent  Comments: (+) driving; reports no h/o recent falls     Hand Dominance        Extremity/Trunk Assessment   Upper Extremity Assessment Upper Extremity Assessment: Overall WFL for tasks assessed    Lower Extremity Assessment Lower Extremity Assessment: RLE deficits/detail(L LE WFL) RLE Deficits / Details: hip flexion at least 3/5 AROM  to grossly 70 degrees hip flexion; knee flexion/extension at least 3+/5; DF at least 3+/5 RLE: Unable to fully assess due to pain    Cervical / Trunk Assessment Cervical / Trunk Assessment: Normal  Communication   Communication: No difficulties  Cognition Arousal/Alertness: Awake/alert Behavior During Therapy: WFL for tasks assessed/performed Overall Cognitive Status: Within Functional Limits for tasks assessed                                        General Comments General comments (skin integrity, edema, etc.): R hip hemovac and foley catheter in place; no drainage noted R hip honeycomb dressing.  Nursing cleared pt for participation in physical therapy.  Pt agreeable to PT session.  Pt's daughter present during session.    Exercises Total Joint Exercises Ankle Circles/Pumps: AROM;Strengthening;Both;10 reps;Supine Quad Sets: AROM;Strengthening;Both;10 reps;Supine Gluteal Sets: AROM;Strengthening;Both;10 reps;Supine Towel Squeeze: AROM;Strengthening;Both;10 reps;Supine(pillow folded between pt's knees) Short Arc Quad: AROM;Strengthening;Right;10 reps;Supine Heel Slides: AAROM;Strengthening;Right;10 reps;Supine Hip ABduction/ADduction: AAROM;Strengthening;Right;10 reps;Supine   Assessment/Plan    PT Assessment Patient needs continued PT services  PT Problem List Decreased strength;Decreased activity tolerance;Decreased balance;Decreased mobility;Decreased knowledge of use of DME;Decreased knowledge of precautions;Pain       PT Treatment Interventions DME instruction;Gait training;Functional mobility training;Therapeutic activities;Therapeutic exercise;Balance training;Stair training;Patient/family education    PT Goals (Current goals can be found in the Care Plan section)  Acute Rehab PT Goals Patient Stated Goal: to go home PT Goal Formulation: With patient/family Time For Goal Achievement: 10/08/17 Potential to Achieve Goals: Good    Frequency BID    Barriers to discharge        Co-evaluation               AM-PAC PT "6 Clicks" Daily Activity  Outcome Measure Difficulty turning over in bed (including adjusting bedclothes, sheets and blankets)?: A Little Difficulty moving from lying on back to sitting on the side of the bed? : Unable Difficulty sitting down on and standing up from a chair with arms (e.g., wheelchair, bedside commode, etc,.)?: Unable Help needed moving to and from a bed to chair (including a wheelchair)?: A Little Help needed walking in hospital room?: A Little Help needed climbing 3-5 steps with a railing? : A Lot 6 Click Score: 13    End of Session Equipment Utilized During Treatment: Gait belt;Oxygen(2 L O2 via nasal cannula) Activity Tolerance: Patient tolerated treatment well Patient left: in chair;with call bell/phone within reach;with chair alarm set;with nursing/sitter in room;with family/visitor present;with SCD's reapplied(pillows placed between pt's knees and B heels elevated via pillows) Nurse Communication: Mobility status;Precautions;Weight bearing status PT Visit Diagnosis: Other abnormalities of gait and mobility (R26.89);Muscle weakness (generalized) (M62.81);Difficulty in walking, not elsewhere classified (R26.2);Pain Pain - Right/Left: Right Pain - part of body: Hip    Time: 7591-6384 PT Time Calculation (min) (ACUTE ONLY): 40 min   Charges:   PT Evaluation $PT Eval Low Complexity: 1 Low PT Treatments $Therapeutic Exercise: 8-22 mins $Therapeutic Activity: 8-22 mins   PT G Codes:        Leitha Bleak, PT  09/24/17, 5:06 PM [phone]

## 2017-09-24 NOTE — Progress Notes (Signed)
Chaplain prayed for patient and medical team silently.

## 2017-09-24 NOTE — Anesthesia Preprocedure Evaluation (Signed)
Anesthesia Evaluation  Patient identified by MRN, date of birth, ID band Patient awake    Reviewed: Allergy & Precautions, H&P , NPO status , Patient's Chart, lab work & pertinent test results, reviewed documented beta blocker date and time   Airway Mallampati: II   Neck ROM: full    Dental  (+) Teeth Intact   Pulmonary neg pulmonary ROS, former smoker,    Pulmonary exam normal        Cardiovascular negative cardio ROS Normal cardiovascular exam+ dysrhythmias  Rhythm:regular Rate:Normal     Neuro/Psych  Neuromuscular disease negative neurological ROS  negative psych ROS   GI/Hepatic negative GI ROS, Neg liver ROS, hiatal hernia, GERD  Medicated,  Endo/Other  negative endocrine ROS  Renal/GU negative Renal ROS  negative genitourinary   Musculoskeletal   Abdominal   Peds  Hematology negative hematology ROS (+)   Anesthesia Other Findings Past Medical History: No date: Arthritis No date: Cardiac arrhythmia No date: Dysrhythmia No date: Esophageal stricture No date: GERD (gastroesophageal reflux disease) No date: Hiatal hernia No date: HLD (hyperlipidemia) Past Surgical History: No date: ABDOMINAL HYSTERECTOMY 04/18/14: BREAST BIOPSY; Left     Comment:  negative No date: EYE SURGERY; Bilateral   Reproductive/Obstetrics negative OB ROS                             Anesthesia Physical Anesthesia Plan  ASA: III  Anesthesia Plan: General and Spinal   Post-op Pain Management:    Induction:   PONV Risk Score and Plan: 4 or greater  Airway Management Planned:   Additional Equipment:   Intra-op Plan:   Post-operative Plan:   Informed Consent: I have reviewed the patients History and Physical, chart, labs and discussed the procedure including the risks, benefits and alternatives for the proposed anesthesia with the patient or authorized representative who has indicated his/her  understanding and acceptance.   Dental Advisory Given  Plan Discussed with: CRNA  Anesthesia Plan Comments:         Anesthesia Quick Evaluation

## 2017-09-25 LAB — BASIC METABOLIC PANEL
Anion gap: 7 (ref 5–15)
BUN: 13 mg/dL (ref 6–20)
CALCIUM: 8.9 mg/dL (ref 8.9–10.3)
CO2: 26 mmol/L (ref 22–32)
CREATININE: 0.96 mg/dL (ref 0.44–1.00)
Chloride: 108 mmol/L (ref 101–111)
GFR calc Af Amer: >60 mL/min (ref >60)
GFR, EST NON AFRICAN AMERICAN: 54 mL/min — AB (ref >60)
Glucose, Bld: 118 mg/dL — ABNORMAL HIGH (ref 65–99)
POTASSIUM: 4.3 mmol/L (ref 3.5–5.1)
SODIUM: 141 mmol/L (ref 135–145)

## 2017-09-25 LAB — HEMOGLOBIN AND HEMATOCRIT, BLOOD
HEMATOCRIT: 33.7 % — AB (ref 35.0–47.0)
Hemoglobin: 11.3 g/dL — ABNORMAL LOW (ref 12.0–16.0)

## 2017-09-25 MED ORDER — SODIUM CHLORIDE 0.9 % IV BOLUS (SEPSIS)
500.0000 mL | Freq: Once | INTRAVENOUS | Status: AC
  Administered 2017-09-25: 500 mL via INTRAVENOUS

## 2017-09-25 MED ORDER — OXYCODONE HCL 5 MG PO TABS: 5.0000 mg | ORAL_TABLET | ORAL | 0 refills | Status: DC | PRN

## 2017-09-25 MED ORDER — ENOXAPARIN SODIUM 40 MG/0.4ML ~~LOC~~ SOLN: 40.0000 mg | SUBCUTANEOUS | 0 refills | Status: DC

## 2017-09-25 MED ORDER — TRAMADOL HCL 50 MG PO TABS: 50.0000 mg | ORAL_TABLET | ORAL | 0 refills | Status: DC | PRN

## 2017-09-25 NOTE — Progress Notes (Signed)
Physical Therapy Treatment Patient Details Name: Shannon Hendrix MRN: [id number] DOB: 06/20/1934 Today's Date: 09/25/2017    History of Present Illness Pt is an 84 y.o. female s/p R THA posterior approach 09/24/17.  Pt seen for PT eval on POD #0.  PMH includes hiatal hernia and cardiac arrhythmia.    PT Comments    Spoke with nursing prior to treatment; nursing advised to keep pt in bed and perform supine bed exercises only due to increased HR at rest today. HR 128 bpm initially. Pt participates well with supine bed exercises with assist as needed on the R. Pt remembers 3/3 hip precautions with increased time. HR post exercises 127 bpm. Continue PT to progress strength and endurance with safe HR levels to improve functional mobility.    Follow Up Recommendations  Home health PT     Equipment Recommendations  Rolling walker with 5" wheels;3in1 (PT)    Recommendations for Other Services OT consult     Precautions / Restrictions Precautions Precautions: Fall;Posterior Hip Precaution Booklet Issued: Yes (comment) Restrictions Weight Bearing Restrictions: Yes RLE Weight Bearing: Weight bearing as tolerated    Mobility  Bed Mobility               General bed mobility comments: Not tested; spoke with nursing prior to treatment who advised bed exercises only at this time due to increased HR today   Transfers                    Ambulation/Gait                 Stairs            Wheelchair Mobility    Modified Rankin (Stroke Patients Only)       Balance                                            Cognition Arousal/Alertness: Awake/alert Behavior During Therapy: WFL for tasks assessed/performed Overall Cognitive Status: Within Functional Limits for tasks assessed                                        Exercises Total Joint Exercises Ankle Circles/Pumps: AROM;Both;20 reps;Supine Quad Sets:  Strengthening;Both;20 reps;Supine Gluteal Sets: Strengthening;Both;20 reps;Supine Towel Squeeze: Strengthening;Both;20 reps;Supine Short Arc Quad: AROM;Both;20 reps;Supine Heel Slides: AAROM;Right;20 reps;Supine(within appropriate range; AROM L ) Hip ABduction/ADduction: AAROM;Right;20 reps;Supine(AROM L) Straight Leg Raises: AAROM;10 reps;Supine;Right(2 sets; L strengthening) Other Exercises Other Exercises: R hip AROM ext rot 20x supine    General Comments        Pertinent Vitals/Pain Pain Assessment: 0-10 Pain Score: 2  Pain Location: R hip Pain Descriptors / Indicators: Discomfort Pain Intervention(s): Monitored during session    Home Living                      Prior Function            PT Goals (current goals can now be found in the care plan section) Acute Rehab PT Goals Patient Stated Goal: to go home PT Goal Formulation: With patient/family Time For Goal Achievement: 10/08/17 Potential to Achieve Goals: Good Progress towards PT goals: Progressing toward goals(Limited at this time due to HR/BP issues)    Frequency    BID  PT Plan Current plan remains appropriate    Co-evaluation              AM-PAC PT "6 Clicks" Daily Activity  Outcome Measure  Difficulty turning over in bed (including adjusting bedclothes, sheets and blankets)?: A Little Difficulty moving from lying on back to sitting on the side of the bed? : Unable Difficulty sitting down on and standing up from a chair with arms (e.g., wheelchair, bedside commode, etc,.)?: Unable Help needed moving to and from a bed to chair (including a wheelchair)?: A Little Help needed walking in hospital room?: A Little Help needed climbing 3-5 steps with a railing? : A Lot 6 Click Score: 13    End of Session Equipment Utilized During Treatment: Gait belt Activity Tolerance: Patient tolerated treatment well Patient left: in bed;with call bell/phone within reach;with bed alarm set;with  SCD's reapplied Nurse Communication: Other (comment)(HR/BP prior to session) PT Visit Diagnosis: Other abnormalities of gait and mobility (R26.89);Muscle weakness (generalized) (M62.81);Difficulty in walking, not elsewhere classified (R26.2);Pain Pain - Right/Left: Right Pain - part of body: Hip     Time: 4920-1007 PT Time Calculation (min) (ACUTE ONLY): 29 min  Charges:  $Therapeutic Exercise: 23-37 mins                    G Codes:        Larae Grooms, PTA 09/25/2017, 3:16 PM

## 2017-09-25 NOTE — Progress Notes (Signed)
OT Cancellation Note  Patient Details Name: Shannon Hendrix MRN: [id number] DOB: 11-22-1933   Cancelled Treatment:    Reason Eval/Treat Not Completed: Medical issues which prohibited therapy Order received and chart reviewed.  Pt with low BP of 84/48 and spoke to [facility] from [place] who rec holding OT evaluation until the afternoon and attempt again.  Spoke to patient and updated her about plan.  Chrys Racer, OTR/L ascom [phone] 09/25/17, 10:05 AM

## 2017-09-25 NOTE — Progress Notes (Signed)
Spoke with MD Rudene Christians for low bp 84/46.   500 ml bolus ordered for over 2 hours and recheck bp.

## 2017-09-25 NOTE — Progress Notes (Signed)
Rept to Dr. Marry Guan. Pt BP 85/63 P 122. No CP or SOB. Pt denies pain. Orders received for 500 cc NS IV bolus and hemoglobin, hematocrit and BMET. Will continue to monitor.

## 2017-09-25 NOTE — Progress Notes (Signed)
   Subjective: 1 Day Post-Op Procedure(s) (LRB): TOTAL HIP ARTHROPLASTY (Right) Patient reports pain as moderate.   Patient is well, and has had no acute complaints or problems Patient did do bed to chair transfers with physical therapy yesterday. Plan is to go Home after hospital stay. no nausea and no vomiting Patient denies any chest pains or shortness of breath. Patient has been hypotensive during surgery as well as after surgery.  Was given vasopressin's during the surgery as well as in PACU.  Has been tachycardia which she had prior to surgery along with PACs. Patient denies any palpitations on today's visit. Did not sleep very well secondary to interruptions.  Objective: Vital signs in last 24 hours: Temp:  [97.5 F (36.4 C)-98.8 F (37.1 C)] 98.2 F (36.8 C) (02/28 0401) Pulse Rate:  [88-120] 120 (02/28 0401) Resp:  [10-25] 18 (02/27 1607) BP: (73-135)/(42-88) 82/53 (02/28 0401) SpO2:  [83 %-100 %] 95 % (02/28 0401) Weight:  [72.6 kg (160 lb)] 72.6 kg (160 lb) (02/27 1607) Heels are non tender and elevated off the bed using rolled towels  Intake/Output from previous day: 02/27 0701 - 02/28 0700 In: 3931.7 [P.O.:480; I.V.:2851.7; IV Piggyback:600] Out: 3015 [Urine:2425; Emesis/NG output:100; Drains:240; Blood:250] Intake/Output this shift: No intake/output data recorded.  Recent Labs    09/25/17 0700  HGB 11.3*   Recent Labs    09/25/17 0700  HCT 33.7*   Recent Labs    09/25/17 0700  NA 141  K 4.3  CL 108  CO2 26  BUN 13  CREATININE 0.96  GLUCOSE 118*  CALCIUM 8.9   No results for input(s): LABPT, INR in the last 72 hours.  EXAM General - Patient is Alert, Appropriate and Oriented Extremity - Neurologically intact Neurovascular intact Sensation intact distally Intact pulses distally Dorsiflexion/Plantar flexion intact No cellulitis present Compartment soft Dressing - dressing C/D/I Motor Function - intact, moving foot and toes well on exam.     Past Medical History:  Diagnosis Date  . Arthritis   . Cardiac arrhythmia   . Dysrhythmia   . Esophageal stricture   . GERD (gastroesophageal reflux disease)   . Hiatal hernia   . HLD (hyperlipidemia)     Assessment/Plan: 1 Day Post-Op Procedure(s) (LRB): TOTAL HIP ARTHROPLASTY (Right) Active Problems:   Status post total replacement of hip  Estimated body mass index is 26.63 kg/m as calculated from the following:   Height as of this encounter: 5\' 5"  (1.651 m).   Weight as of this encounter: 72.6 kg (160 lb). Advance diet Up with therapy D/C IV fluids Plan for discharge tomorrow Discharge home with home health  Labs: Pending DVT Prophylaxis - Lovenox, Foot Pumps and TED hose Weight-Bearing as tolerated to right leg D/C O2 and Pulse OX and try on Room Air Begin working on bowel movement We will hold blood pressure medicine for now May need to contact medicine for consultation. Okay to do physical therapy.  Patient is asymptomatic  Jillyn Ledger. [place] 09/25/2017, 7:28 AM

## 2017-09-25 NOTE — Discharge Summary (Signed)
Physician Discharge Summary  Patient ID: Shannon Hendrix MRN: [id number] DOB/AGE: 82-Aug-1935 82 y.o.  Admit date: 09/24/2017 Discharge date: 09/26/2017  Admission Diagnoses:  PRIMARY OSTEOARTHRITIS OF RIGHT HIP   Discharge Diagnoses: Patient Active Problem List   Diagnosis Date Noted  . Status post total replacement of hip 09/24/2017  . Psoriasis, unspecified 08/11/2017  . Osteoporosis, post-menopausal 08/11/2017  . GERD (gastroesophageal reflux disease) 08/11/2017  . Hyperglycemia, unspecified 08/11/2017  . Hyperlipidemia, unspecified 08/11/2017  . Rectal bleed 01/19/2017  . OA (osteoarthritis) of hip 02/18/2014    Past Medical History:  Diagnosis Date  . Arthritis   . Cardiac arrhythmia   . Dysrhythmia   . Esophageal stricture   . GERD (gastroesophageal reflux disease)   . Hiatal hernia   . HLD (hyperlipidemia)      Transfusion: No transfusions during this admission   Consultants (if any):   Discharged Condition: Improved  Hospital Course: Shannon Hendrix is an 84 y.o. female who was admitted 09/24/2017 with a diagnosis of degenerative arthrosis right hip and went to the operating room on 09/24/2017 and underwent the above named procedures.    Surgeries:Procedure(s): TOTAL HIP ARTHROPLASTY on 09/24/2017  PRE-OPERATIVE DIAGNOSIS: Degenerative arthrosis of the right hip, primary  POST-OPERATIVE DIAGNOSIS:  Same  PROCEDURE:  Right total hip arthroplasty  SURGEON:  Marciano Sequin. M.D.  ASSISTANT:  Vance Peper, PA (present and scrubbed throughout the case, critical for assistance with exposure, retraction, instrumentation, and closure)  ANESTHESIA: spinal  ESTIMATED BLOOD LOSS: 100 mL  FLUIDS REPLACED: 1300 mL of crystalloid  DRAINS: 2 medium drains to a Hemovac reservoir  IMPLANTS UTILIZED: DePuy 13.5 mm small stature AML femoral stem, 52 mm OD Pinnacle 100 acetabular component, +4 mm neutral Pinnacle Marathon polyethylene insert, and a 36 mm  M-SPEC +1.5 mm hip ball  INDICATIONS FOR SURGERY: Shannon Hendrix is a 84 y.o. year old female with a long history of progressive hip and groin  pain. X-rays demonstrated severe degenerative changes. The patient had not seen any significant improvement despite conservative nonsurgical intervention. After discussion of the risks and benefits of surgical intervention, the patient expressed understanding of the risks benefits and agree with plans for total hip arthroplasty.   The risks, benefits, and alternatives were discussed at length including but not limited to the risks of infection, bleeding, nerve injury, stiffness, blood clots, the need for revision surgery, limb length inequality, dislocation, cardiopulmonary complications, among others, and they were willing to proceed.   Patient tolerated the surgery well. No complications .Patient was taken to PACU where she was stabilized and then transferred to the orthopedic floor.  Patient started on Lovenox 30 mg q 12 hrs. Foot pumps applied bilaterally at 80 mm hgb. Heels elevated off bed with rolled towels. No evidence of DVT. Calves non tender. Negative Homan. Physical therapy started on day #1 for gait training and transfer with OT starting on  day #1 for ADL and assisted devices. Patient has done well with therapy. Ambulated greater than 200 feet upon being discharged.  Was able to ascend and descend 4 steps safely and independently  Patient's IV And Foley were discontinued on day #1 with Hemovac being discontinued on day #2. Dressing was changed on day 2 prior to patient being discharged  On postop day 3 patient was stable and ready for discharge to home with home health PT.   She was given perioperative antibiotics:  Anti-infectives (From admission, onward)   Start     Dose/Rate  Route Frequency Ordered Stop   09/24/17 1400  ceFAZolin (ANCEF) IVPB 2g/100 mL premix     2 g 200 mL/hr over 30 Minutes Intravenous Every 6 hours 09/24/17 1324  09/25/17 1134   09/24/17 0554  ceFAZolin (ANCEF) 2-4 GM/100ML-% IVPB    Comments:  Tobin Chad, Eve   : cabinet override      09/24/17 0554 09/24/17 0754   09/23/17 2215  ceFAZolin (ANCEF) IVPB 2g/100 mL premix     2 g 200 mL/hr over 30 Minutes Intravenous  Once 09/23/17 2209 09/24/17 0804   08/11/17 0600  ceFAZolin (ANCEF) IVPB 2g/100 mL premix     2 g 200 mL/hr over 30 Minutes Intravenous On call to O.R. 08/10/17 2155 08/12/17 0559    .  She was fitted with AV 1 compression foot pump devices, instructed on heel pumps, early ambulation, and fitted with TED stockings bilaterally for DVT prophylaxis.  She benefited maximally from the hospital stay and there were no complications.    Recent vital signs:  Vitals:   09/27/17 0505 09/27/17 0733  BP: 116/68 123/76  Pulse: (!) 109 (!) 117  Resp: 16 18  Temp: 98.5 F (36.9 C) 99.1 F (37.3 C)  SpO2: 91% 99%    Recent laboratory studies:  Lab Results  Component Value Date   HGB 11.3 (L) 09/25/2017   HGB 14.6 01/20/2017   HGB 14.3 01/20/2017   Lab Results  Component Value Date   WBC 8.8 01/20/2017   PLT 202 01/20/2017   Lab Results  Component Value Date   INR 0.94 07/30/2017   Lab Results  Component Value Date   NA 141 09/25/2017   K 4.3 09/25/2017   CL 108 09/25/2017   CO2 26 09/25/2017   BUN 13 09/25/2017   CREATININE 0.96 09/25/2017   GLUCOSE 118 (H) 09/25/2017    Discharge Medications:   Allergies as of 09/27/2017      Reactions   Nitrofurantoin Rash      Medication List    TAKE these medications   acetaminophen 500 MG tablet Commonly known as:  TYLENOL Take 1,000 mg by mouth every 6 (six) hours as needed (for pain.).   amitriptyline 50 MG tablet Commonly known as:  ELAVIL Take 50 mg by mouth at bedtime.   enoxaparin 40 MG/0.4ML injection Commonly known as:  LOVENOX Inject 0.4 mLs (40 mg total) into the skin daily for 14 days.   Fish Oil 1000 MG Caps Take 2 capsules by mouth 2 (two) times daily.    lovastatin 20 MG tablet Commonly known as:  MEVACOR Take 20 mg by mouth at bedtime.   metoprolol tartrate 25 MG tablet Commonly known as:  LOPRESSOR Take 25 mg by mouth 2 (two) times daily.   multivitamin with minerals Tabs tablet Take 1 tablet by mouth daily.   omeprazole 20 MG capsule Commonly known as:  PRILOSEC Take 20 mg by mouth daily.   oxyCODONE 5 MG immediate release tablet Commonly known as:  Oxy IR/ROXICODONE Take 1 tablet (5 mg total) by mouth every 3 (three) hours as needed for moderate pain ((score 4 to 6)).   traMADol 50 MG tablet Commonly known as:  ULTRAM Take 1-2 tablets (50-100 mg total) by mouth every 4 (four) hours as needed for moderate pain.   vitamin B-12 1000 MCG tablet Commonly known as:  CYANOCOBALAMIN Take 1,000 mcg by mouth daily.   Vitamin D-3 5000 units Tabs Take 5,000 Units by mouth daily.  Durable Medical Equipment  (From admission, onward)        Start     Ordered   09/24/17 1325  DME Walker rolling  Once    Question:  Patient needs a walker to treat with the following condition  Answer:  S/P total hip arthroplasty   09/24/17 1324   09/24/17 1325  DME Bedside commode  Once    Question:  Patient needs a bedside commode to treat with the following condition  Answer:  S/P total hip arthroplasty   09/24/17 1324      Diagnostic Studies: Dg Hip Port Unilat With Pelvis 1v Right  Result Date: 09/24/2017 CLINICAL DATA:  Status post right total hip replacement. EXAM: DG HIP (WITH OR WITHOUT PELVIS) 1V PORT RIGHT COMPARISON:  None. FINDINGS: The femoral and acetabular components appear to be well situated. No fracture or dislocation is noted. Expected postoperative changes are seen in the surrounding soft tissues. IMPRESSION: Status post right total hip arthroplasty. Electronically Signed   By: Marijo Conception, M.D.   On: 09/24/2017 11:48    Disposition: 01-Home or Self Care  Discharge Instructions    Increase activity  slowly   Complete by:  As directed       Follow-up Information    Hooten, Laurice Record, MD Follow up on 11/06/2017.   Specialty:  Orthopedic Surgery Why:  at 2:00pm Contact information: [place] [id number] [phone]            Signed: Feliberto Gottron 09/27/2017, 9:36 AM

## 2017-09-25 NOTE — Progress Notes (Signed)
OT Cancellation Note  Patient Details Name: Shannon Hendrix MRN: [id number] DOB: Dec 27, 1933   Cancelled Treatment:    Reason Eval/Treat Not Completed: Medical issues which prohibited therapy.  Attempted OT evaluation again but deferred until tomorrow since [facility]) indicated that patient received medication recently for HR and would be better to hold off on evaluation.  Will assess again tomorrow morning.  Pt updated and agreed to plan.  Chrys Racer, OTR/L ascom [phone] 09/25/17, 2:17 PM

## 2017-09-25 NOTE — Progress Notes (Addendum)
Rept to Dr. Marry Guan. Pt BP 112/74. Pt BP has been 112's over 70's for 3 vital sign checks. Pulse at 1400 134. Gave AM dose of Lopressor at 1300. Pulse has been regular rate and rhythm. Pt denies CP, SOB or any associated signs/symptoms of distress related to heart rate. Pt has voided 700 cc.  Per his order, continue to monitor vital signs and pt overall condition and notify him.

## 2017-09-25 NOTE — Plan of Care (Signed)
  Education: Knowledge of General Education information will improve 09/25/2017 [phone] - Progressing by Mikia Delaluz, Lucille Passy, RN   Health Behavior/Discharge Planning: Ability to manage health-related needs will improve 09/25/2017 0337 - Progressing by Geovannie Vilar, Lucille Passy, RN   Clinical Measurements: Ability to maintain clinical measurements within normal limits will improve 09/25/2017 0337 - Progressing by Kayshawn Ozburn, Lucille Passy, RN Will remain free from infection 09/25/2017 6967 - Progressing by Judiann Celia, Lucille Passy, RN Diagnostic test results will improve 09/25/2017 [phone] - Progressing by Yeimi Debnam, Lucille Passy, RN Respiratory complications will improve 09/25/2017 [phone] - Progressing by Bryna Colander, RN Cardiovascular complication will be avoided 09/25/2017 5102 - Progressing by Maveric Debono, Lucille Passy, RN   Nutrition: Adequate nutrition will be maintained 09/25/2017 5852 - Progressing by Bryna Colander, RN   Coping: Level of anxiety will decrease 09/25/2017 0337 - Progressing by Bryna Colander, RN   Activity: Risk for activity intolerance will decrease 09/25/2017 7782 - Progressing by Alyce Inscore, Lucille Passy, RN

## 2017-09-25 NOTE — Care Management Note (Signed)
Case Management Note  Patient Details  Name: Shannon Hendrix MRN: [id number] Date of Birth: 27-Dec-1933  Subjective/Objective:  POD # 1 right THA. Met with patient at bedside. She lives alone. Her daughter and cousin will be helping to care for her. She has a walker and bsc. Offered choice of home health agencies. Referral to [facility] for HHPT. Pharmacy: [facility]: [phone]. Will check cost of Lovenox prior to discharge.                   Action/Plan: [facility] for HHPT. No DME.   Expected Discharge Date:                  Expected Discharge Plan:  [name]  In-House Referral:     Discharge planning Services  CM Consult  Post Acute Care Choice:  Home Health Choice offered to:  Patient  DME Arranged:    DME Agency:     HH Arranged:  PT [name]:  [facility] (formerly [facility])  Status of Service:  In process, will continue to follow  If discussed at Long Length of Stay Meetings, dates discussed:    Additional Comments:  Jolly Mango, RN 09/25/2017, 12:59 PM

## 2017-09-25 NOTE — Progress Notes (Signed)
Physical Therapy Treatment Patient Details Name: Shannon Hendrix MRN: [id number] DOB: [birth year] Today's Date: 09/25/2017    History of Present Illness Pt is an 84 y.o. female s/p R THA posterior approach 09/24/17.  Pt seen for PT eval on POD #0.  PMH includes hiatal hernia and cardiac arrhythmia.    PT Comments    Pt's systolic BP in 72'C prior to PT session (d/t low BP, plan for bed level ex's per discussion with nursing).  After performing LE ex's in bed pt's BP increased to 115/67 but pt's HR also increased from 120 bpm to 135 bpm (nursing notified of pt's vitals after ex's in bed).  OOB mobility deferred d/t elevated HR (HR staying around 130's end of session resting in bed).  Nursing reporting pt did not receive all of her heart medications this morning d/t low BP (pt reports she normally takes medication to control her HR).  Pt's daughter present end of session and (with pt's permission) discussed pt's current status with therapy and answered her questions.   Will attempt OOB mobility this afternoon as appropriate.  Initial plan for HHPT but will need to see how pt progresses with functional mobility during hospital stay.   Follow Up Recommendations  Home health PT     Equipment Recommendations  Rolling walker with 5" wheels;3in1 (PT)    Recommendations for Other Services OT consult     Precautions / Restrictions Precautions Precautions: Fall;Posterior Hip Precaution Booklet Issued: Yes (comment) Restrictions Weight Bearing Restrictions: Yes RLE Weight Bearing: Weight bearing as tolerated    Mobility  Bed Mobility               General bed mobility comments: Deferred (see PT comments section)  Transfers                    Ambulation/Gait                 Stairs            Wheelchair Mobility    Modified Rankin (Stroke Patients Only)       Balance                                            Cognition  Arousal/Alertness: Awake/alert Behavior During Therapy: WFL for tasks assessed/performed Overall Cognitive Status: Within Functional Limits for tasks assessed                                        Exercises Total Joint Exercises Ankle Circles/Pumps: AROM;Strengthening;Both;10 reps;Supine Quad Sets: AROM;Strengthening;Both;10 reps;Supine Gluteal Sets: AROM;Strengthening;Both;10 reps;Supine Towel Squeeze: AROM;Strengthening;Both;10 reps;Supine Short Arc Quad: AROM;Strengthening;Right;10 reps;Supine Heel Slides: AAROM;Strengthening;Right;10 reps;Supine Hip ABduction/ADduction: AAROM;Strengthening;Right;10 reps;Supine    General Comments   Nursing cleared pt for participation in physical therapy (bed level ex's d/t low BP).  Pt agreeable to PT session.      Pertinent Vitals/Pain Pain Assessment: 0-10 Pain Score: 2  Pain Location: R hip Pain Descriptors / Indicators: Sore;Tender Pain Intervention(s): Limited activity within patient's tolerance;Monitored during session;Premedicated before session;Repositioned(Nursing notified of need for ice for ice pack)    Home Living                      Prior Function  PT Goals (current goals can now be found in the care plan section) Acute Rehab PT Goals Patient Stated Goal: to go home PT Goal Formulation: With patient/family Time For Goal Achievement: 10/08/17 Potential to Achieve Goals: Good Progress towards PT goals: Progressing toward goals(for LE strengthening)    Frequency    BID      PT Plan Current plan remains appropriate    Co-evaluation              AM-PAC PT "6 Clicks" Daily Activity  Outcome Measure  Difficulty turning over in bed (including adjusting bedclothes, sheets and blankets)?: A Little Difficulty moving from lying on back to sitting on the side of the bed? : Unable Difficulty sitting down on and standing up from a chair with arms (e.g., wheelchair, bedside  commode, etc,.)?: Unable Help needed moving to and from a bed to chair (including a wheelchair)?: A Little Help needed walking in hospital room?: A Little Help needed climbing 3-5 steps with a railing? : A Lot 6 Click Score: 13    End of Session Equipment Utilized During Treatment: Gait belt Activity Tolerance: Patient tolerated treatment well Patient left: in bed;with call bell/phone within reach;with bed alarm set;with nursing/sitter in room;with family/visitor present;with SCD's reapplied(pillows placed between pt's knees for posterior hip precautions and B heels elevated via pillows) Nurse Communication: Mobility status;Precautions;Weight bearing status PT Visit Diagnosis: Other abnormalities of gait and mobility (R26.89);Muscle weakness (generalized) (M62.81);Difficulty in walking, not elsewhere classified (R26.2);Pain Pain - part of body: Hip     Time: 4825-0037 PT Time Calculation (min) (ACUTE ONLY): 40 min  Charges:  $Therapeutic Exercise: 38-52 mins                    G CodesLeitha Bleak, PT 09/25/17, 1:32 PM [phone]

## 2017-09-25 NOTE — Progress Notes (Signed)
Paged ortho on call BP 84/48. Patient is Alert and oriented with no signs of distress.

## 2017-09-26 LAB — SURGICAL PATHOLOGY

## 2017-09-26 NOTE — Progress Notes (Addendum)
Occupational Therapy Treatment Patient Details Name: Shannon Hendrix MRN: [id number] DOB: May 26, 1934 Today's Date: 09/26/2017    History of present illness Pt. is an 84 y.o. female who was admitted to [facility] for a posterior THA on the right. Pt. PMHx includes: hiatal hernis, and cardiac arrhythmia.   OT comments  Pt. Presents with 4/10 pain, weakness, limited mobility, and posterior hip precautions which limit her ability to complete ADL, and IADL tasks. Pt. Was able to recall 2/3 hip precautions. Pt. resides at home alone in a one storey townhouse. Pt. Reports her daughter lives very close by. Pt. Was independent with ADLs, and IADLs prior to admission. Pt. was able to recall 2/3 hip precautions. Pt. education was provided about A/E use for LE ADLs, hip precautions, and safety with transfers.Pt. HR 92 bpm. Pt. Could benefit from OT services for ADL training, A/E training, posterior hip precautions, and pt. education about home modification, and DME. Pt. Plans to return home upon discharge with family assistance. Pt. Could benefit from follow-up OT services upon discharge.   Follow Up Recommendations  Home health OT    Equipment Recommendations       Recommendations for Other Services Rehab consult    Precautions / Restrictions Precautions Precautions: Posterior Hip;Fall Precaution Booklet Issued: Yes (comment) Restrictions Weight Bearing Restrictions: Yes RLE Weight Bearing: Weight bearing as tolerated       Mobility Bed Mobility                  Transfers Overall transfer level: Needs assistance   Transfers: Sit to/from Stand Sit to Stand: Min guard         General transfer comment: Min guard transfers [facility] to Chair. Cues for hand placement.    Balance                                           ADL either performed or assessed with clinical judgement   ADL Overall ADL's : Needs assistance/impaired Eating/Feeding: Set up   Grooming: Set up    Upper Body Bathing: Independent   Lower Body Bathing: Moderate assistance   Upper Body Dressing : Independent   Lower Body Dressing: Moderate assistance Lower Body Dressing Details (indicate cue type and reason): Pt. education was provided about A/E use for LE ADls. Toilet Transfer: Loss adjuster, chartered and Hygiene: Min guard       Functional mobility during ADLs: Min guard General ADL Comments: Pt. has a Secondary school teacher, and [employer]     Vision       Perception     Praxis      Cognition Arousal/Alertness: Awake/alert Behavior During Therapy: WFL for tasks assessed/performed Overall Cognitive Status: Within Functional Limits for tasks assessed                                          Exercises     Shoulder Instructions       General Comments      Pertinent Vitals/ Pain       Pain Assessment: 0-10 Pain Score: 4 Pain Location: Right Hip Pain Descriptors / Indicators: Discomfort Pain Intervention(s): Monitored during session  Home Living Family/patient expects to be discharged to:: Private residence Living Arrangements: Alone Available Help at Discharge: Family Type  of Home: House Home Access: Level entry     Home Layout: One level     Bathroom Shower/Tub: Walk-in shower         Home Equipment: Bedside commode;Walker - 2 wheels          Prior Functioning/Environment Level of Independence: Independent            Frequency  Min 2X/week        Progress Toward Goals  OT Goals(current goals can now be found in the care plan section)     Acute Rehab OT Goals Patient Stated Goal: To go home OT Goal Formulation: With patient Potential to Achieve Goals: Good  Plan      Co-evaluation                 AM-PAC PT "6 Clicks" Daily Activity     Outcome Measure   Help from another person eating meals?: None Help from another person taking care of personal grooming?: None Help from another  person toileting, which includes using toliet, bedpan, or urinal?: A Little Help from another person bathing (including washing, rinsing, drying)?: A Lot Help from another person to put on and taking off regular upper body clothing?: A Little Help from another person to put on and taking off regular lower body clothing?: A Lot 6 Click Score: 18    End of Session Equipment Utilized During Treatment: Gait belt  OT Visit Diagnosis: Unsteadiness on feet (R26.81)   Activity Tolerance Patient tolerated treatment well   Patient Left in chair;with call bell/phone within reach;with chair alarm set   Nurse Communication          Time: 1055-1130 OT Time Calculation (min): 35 min  Charges: OT General Charges $OT Visit: 1 Visit OT Evaluation $OT Eval Moderate Complexity: 1 Mod Harrel Carina, MS, OTR/L  Harrel Carina, MS, OTR/L 09/26/2017, 11:52 AM

## 2017-09-26 NOTE — Progress Notes (Signed)
Lovenox teaching done. Patient demonstrated injection.

## 2017-09-26 NOTE — Progress Notes (Signed)
Physical Therapy Treatment Patient Details Name: Shannon Hendrix MRN: [id number] DOB: [birth year] Today's Date: 09/26/2017    History of Present Illness Pt is an 84 y.o. female s/p R THA posterior approach 09/24/17.  PMH includes hiatal hernia and cardiac arrhythmia.    PT Comments    Pt able to progress to ambulating around nursing loop with RW SBA.  Pain 2/10 R hip beginning of session and 4/10 end of session (nursing notified).  HR 102-109 bpm during session.  Pt able to state 3/3 posterior hip precautions with extra time and minimal cueing.  Pt does well with posterior hip precautions in general with functional mobility except requires vc's for maintaining with turning.  Pt tolerated session well.  Will continue to focus on strengthening and progressive functional mobility during hospital stay.  Follow Up Recommendations  Home health PT     Equipment Recommendations  Rolling walker with 5" wheels;3in1 (PT)    Recommendations for Other Services OT consult     Precautions / Restrictions Precautions Precautions: Posterior Hip;Fall Precaution Booklet Issued: Yes (comment) Restrictions Weight Bearing Restrictions: Yes RLE Weight Bearing: Weight bearing as tolerated    Mobility  Bed Mobility               General bed mobility comments: Deferred d/t pt sitting up in chair beginning and end of session.  Transfers Overall transfer level: Needs assistance Equipment used: Rolling walker (2 wheeled) Transfers: Sit to/from Stand Sit to Stand: Supervision         General transfer comment: no vc's for positiong/posterior hip precautions required during session with transfers; steady and safe  Ambulation/Gait Ambulation/Gait assistance: Supervision Ambulation Distance (Feet): 200 Feet Assistive device: Rolling walker (2 wheeled)   Gait velocity: decreased   General Gait Details: partial step through gait pattern; decreased stance time R LE; occasional vc's to increase UE  support through RW to offweight R LE; occasional vc's for posterior hip precautions with turning   Stairs            Wheelchair Mobility    Modified Rankin (Stroke Patients Only)       Balance Overall balance assessment: Needs assistance Sitting-balance support: No upper extremity supported;Feet supported Sitting balance-Leahy Scale: Good Sitting balance - Comments: steady sitting reaching within BOS   Standing balance support: No upper extremity supported Standing balance-Leahy Scale: Fair Standing balance comment: able to stand static no UE support (SBA)                            Cognition Arousal/Alertness: Awake/alert Behavior During Therapy: WFL for tasks assessed/performed Overall Cognitive Status: Within Functional Limits for tasks assessed                                        Exercises Total Joint Exercises Long Arc Quad: AROM;Strengthening;Both;10 reps;Seated    General Comments  Pt agreeable to PT session.      Pertinent Vitals/Pain Pain Assessment: 0-10 Pain Score: 4  Pain Location: Right Hip Pain Descriptors / Indicators: Sore Pain Intervention(s): Limited activity within patient's tolerance;Monitored during session;Repositioned(RN notified regarding pt's pain)  O2 WFL during session.    Home Living                      Prior Function  PT Goals (current goals can now be found in the care plan section) Acute Rehab PT Goals Patient Stated Goal: To go home PT Goal Formulation: With patient Time For Goal Achievement: 10/08/17 Potential to Achieve Goals: Good Progress towards PT goals: Progressing toward goals    Frequency    BID      PT Plan Current plan remains appropriate    Co-evaluation              AM-PAC PT "6 Clicks" Daily Activity  Outcome Measure  Difficulty turning over in bed (including adjusting bedclothes, sheets and blankets)?: A Little Difficulty moving from  lying on back to sitting on the side of the bed? : A Lot Difficulty sitting down on and standing up from a chair with arms (e.g., wheelchair, bedside commode, etc,.)?: A Little Help needed moving to and from a bed to chair (including a wheelchair)?: A Little Help needed walking in hospital room?: A Little Help needed climbing 3-5 steps with a railing? : A Little 6 Click Score: 17    End of Session Equipment Utilized During Treatment: Gait belt Activity Tolerance: Patient tolerated treatment well Patient left: in chair;with call bell/phone within reach;with chair alarm set;with SCD's reapplied(pillows placed between pt's knees (for posterior hip precautions) and to elevated B heels) Nurse Communication: Mobility status;Precautions;Weight bearing status;Other (comment)(pt's HR and pain during session) PT Visit Diagnosis: Other abnormalities of gait and mobility (R26.89);Muscle weakness (generalized) (M62.81);Difficulty in walking, not elsewhere classified (R26.2);Pain Pain - Right/Left: Right Pain - part of body: Hip     Time: 2706-2376 PT Time Calculation (min) (ACUTE ONLY): 24 min  Charges:  $Gait Training: 8-22 mins $Therapeutic Exercise: 8-22 mins                    G CodesLeitha Bleak, PT 09/26/17, 4:59 PM [phone]

## 2017-09-26 NOTE — Progress Notes (Signed)
Physical Therapy Treatment Patient Details Name: Shannon Hendrix MRN: [id number] DOB: 03-21-1934 Today's Date: 09/26/2017    History of Present Illness Pt is an 84 y.o. female s/p R THA posterior approach 09/24/17.  PMH includes hiatal hernia and cardiac arrhythmia.    PT Comments    Pt's HR at rest improved back to baseline HR today (fluctuating around 111-114 bpm resting in bed) and pt appropriate for OOB mobility/activity (BP 142/71 beginning of session and 130/71 end of session).  No c/o dizziness during session (pt reports she feels a lot better than she felt yesterday).  No significant increase in HR noted during session.  Pt min assist for R LE supine to sit; CGA with transfers; and CGA with ambulating 120 feet with RW.  Pain 2/10 R hip beginning of session and 4/10 end of session.  Pt does need intermittent cueing for posterior hip precautions while performing functional mobility (pt able to verbally state 1/3 precautions; pt able to state how to modify activity to maintain precautions with transfers).  Overall pt tolerated session well and progressing well towards PT goals.  Will continue to focus on strengthening and progressive functional mobility/ambulation per pt tolerance.   Follow Up Recommendations  Home health PT     Equipment Recommendations  Rolling walker with 5" wheels;3in1 (PT)    Recommendations for Other Services OT consult     Precautions / Restrictions Precautions Precautions: Posterior Hip;Fall Precaution Booklet Issued: Yes (comment) Restrictions Weight Bearing Restrictions: Yes RLE Weight Bearing: Weight bearing as tolerated    Mobility  Bed Mobility Overal bed mobility: Needs Assistance Bed Mobility: Supine to Sit     Supine to sit: Min assist;HOB elevated     General bed mobility comments: assist for R LE supine to sit  Transfers Overall transfer level: Needs assistance Equipment used: Rolling walker (2 wheeled) Transfers: Sit to/from  [facility] Sit to Stand: Min guard Stand pivot transfers: Min guard       General transfer comment: initial vc's for UE and LE placement/positioning (for posterior hip precautions) but then no further cueing required during session (x1 trial from bed; x2 trials from recliner); stand step turn bed to recliner and recliner to [facility]  Ambulation/Gait Ambulation/Gait assistance: Min guard Ambulation Distance (Feet): 120 Feet Assistive device: Rolling walker (2 wheeled)   Gait velocity: decreased   General Gait Details: decreased stance time R LE; initial vc's to increase UE support through RW to offweight R LE; vc's for posterior hip precautions with turning   Stairs            Wheelchair Mobility    Modified Rankin (Stroke Patients Only)       Balance Overall balance assessment: Needs assistance Sitting-balance support: No upper extremity supported;Feet supported Sitting balance-Leahy Scale: Good Sitting balance - Comments: steady sitting reaching within BOS   Standing balance support: No upper extremity supported Standing balance-Leahy Scale: Fair Standing balance comment: able to stand static no UE support briefly (SBA)                            Cognition Arousal/Alertness: Awake/alert Behavior During Therapy: WFL for tasks assessed/performed Overall Cognitive Status: Within Functional Limits for tasks assessed                                        Exercises Total  Joint Exercises Ankle Circles/Pumps: AROM;Strengthening;Both;10 reps;Supine Quad Sets: AROM;Strengthening;Both;10 reps;Supine Gluteal Sets: AROM;Strengthening;Both;10 reps;Supine Towel Squeeze: AROM;Strengthening;Both;10 reps;Supine Short Arc Quad: AROM;Strengthening;Right;10 reps;Supine Heel Slides: AAROM;Strengthening;Right;10 reps;Supine Hip ABduction/ADduction: AAROM;Strengthening;Right;10 reps;Supine     General Comments   Nursing cleared pt for  participation in physical therapy.  Pt agreeable to PT session.      Pertinent Vitals/Pain Pain Assessment: 0-10 Pain Score: 4  Pain Location: Right Hip Pain Descriptors / Indicators: Sore Pain Intervention(s): Limited activity within patient's tolerance;Monitored during session;Premedicated before session;Repositioned    Home Living Family/patient expects to be discharged to:: Private residence Living Arrangements: Alone Available Help at Discharge: Family Type of Home: House Home Access: Level entry   Home Layout: One level Home Equipment: Bedside commode;Walker - 2 wheels      Prior Function Level of Independence: Independent          PT Goals (current goals can now be found in the care plan section) Acute Rehab PT Goals Patient Stated Goal: To go home PT Goal Formulation: With patient Time For Goal Achievement: 10/08/17 Potential to Achieve Goals: Good Progress towards PT goals: Progressing toward goals    Frequency    BID      PT Plan Current plan remains appropriate    Co-evaluation              AM-PAC PT "6 Clicks" Daily Activity  Outcome Measure  Difficulty turning over in bed (including adjusting bedclothes, sheets and blankets)?: A Little Difficulty moving from lying on back to sitting on the side of the bed? : Unable Difficulty sitting down on and standing up from a chair with arms (e.g., wheelchair, bedside commode, etc,.)?: Unable Help needed moving to and from a bed to chair (including a wheelchair)?: A Little Help needed walking in hospital room?: A Little Help needed climbing 3-5 steps with a railing? : A Little 6 Click Score: 14    End of Session Equipment Utilized During Treatment: Gait belt Activity Tolerance: Patient tolerated treatment well Patient left: in chair;with call bell/phone within reach;with chair alarm set;Other (comment)(pt sitting on BSC with OT present) Nurse Communication: Mobility status;Precautions;Weight bearing  status;Other (comment)(pt's HR and BP during session) PT Visit Diagnosis: Other abnormalities of gait and mobility (R26.89);Muscle weakness (generalized) (M62.81);Difficulty in walking, not elsewhere classified (R26.2);Pain Pain - Right/Left: Right Pain - part of body: Hip     Time: 1010-1053 PT Time Calculation (min) (ACUTE ONLY): 43 min  Charges:  $Gait Training: 8-22 mins $Therapeutic Exercise: 8-22 mins $Therapeutic Activity: 8-22 mins                    G CodesLeitha Bleak, PT 09/26/17, 12:16 PM [phone]

## 2017-09-26 NOTE — Progress Notes (Signed)
Patient is alert and oriented. No signs of distress and denies pain.  Vitals signs stable.  Milk of Mag given this am.

## 2017-09-26 NOTE — Progress Notes (Signed)
   Subjective: 2 Days Post-Op Procedure(s) (LRB): TOTAL HIP ARTHROPLASTY (Right) Patient reports pain as mild.   Patient is well, and has had no acute complaints or problems We will start therapy today.  Plan is to go Home after hospital stay. no nausea and no vomiting Patient denies any chest pains or shortness of breath. No physical therapy was performed yesterday secondary to elevated heart rate and blood pressure which have been normal for patient. Patient voiced no complaints.  not sleeping well   Objective: Vital signs in last 24 hours: Temp:  [98.2 F (36.8 C)-98.5 F (36.9 C)] 98.2 F (36.8 C) (03/01 0444) Pulse Rate:  [108-135] 110 (03/01 0444) Resp:  [16-19] 16 (03/01 0444) BP: (112-152)/(67-92) 135/71 (03/01 0444) SpO2:  [92 %-97 %] 92 % (03/01 0444) well approximated incision Heels are non tender and elevated off the bed using rolled towels Intake/Output from previous day: 02/28 0701 - 03/01 0700 In: 2694 [P.O.:840; I.V.:1854] Out: 2525 [Urine:2325; Drains:200] Intake/Output this shift: No intake/output data recorded.  Recent Labs    09/25/17 0700  HGB 11.3*   Recent Labs    09/25/17 0700  HCT 33.7*   Recent Labs    09/25/17 0700  NA 141  K 4.3  CL 108  CO2 26  BUN 13  CREATININE 0.96  GLUCOSE 118*  CALCIUM 8.9   No results for input(s): LABPT, INR in the last 72 hours.  EXAM General - Patient is Alert, Appropriate and Oriented Extremity - Neurologically intact Neurovascular intact Sensation intact distally Intact pulses distally Dorsiflexion/Plantar flexion intact No cellulitis present Compartment soft Dressing - dressing C/D/I Motor Function - intact, moving foot and toes well on exam.    Past Medical History:  Diagnosis Date  . Arthritis   . Cardiac arrhythmia   . Dysrhythmia   . Esophageal stricture   . GERD (gastroesophageal reflux disease)   . Hiatal hernia   . HLD (hyperlipidemia)     Assessment/Plan: 2 Days Post-Op  Procedure(s) (LRB): TOTAL HIP ARTHROPLASTY (Right) Active Problems:   Status post total replacement of hip  Estimated body mass index is 26.63 kg/m as calculated from the following:   Height as of this encounter: 5\' 5"  (1.651 m).   Weight as of this encounter: 72.6 kg (160 lb). Up with therapy Plan for discharge tomorrow Discharge home with home health  Labs: None.  Labs from yesterday were all within normal limits except for glucose which was 110 DVT Prophylaxis - Lovenox, Foot Pumps and TED hose Weight-Bearing as tolerated to right leg Hemovac was discontinued on today's visit.  The ends of the drain appeared to be intact.  There is no resistance noted with removal of drains  Janiyah Beery R. [place] 09/26/2017, 8:08 AM

## 2017-09-26 NOTE — Progress Notes (Signed)
Clinical Social Worker (CSW) received SNF consult. PT is recommending home health. RN case manager aware of above. Please reconsult if future social work needs arise. CSW signing off.   Taejon Irani, LCSW [phone] 

## 2017-09-27 NOTE — Progress Notes (Signed)
Physical Therapy Treatment Patient Details Name: Shannon Hendrix MRN: [id number] DOB: 1934/04/23 Today's Date: 09/27/2017    History of Present Illness Pt is an 84 y.o. female s/p R THA posterior approach 09/24/17.  PMH includes hiatal hernia and cardiac arrhythmia.    PT Comments    In bed, awaiting discharge today.  Participated in exercises as described below.  Transitioned out of bed with min guard and rails.  She was able to complete a full lap around unit and up/down 4 steps with bilateral rails with ease.  Gait progressing to a step though gait pattern with overall more fluid motion and less WB reliant on walker.  Answered questions regarding recovery process from pt and daughter.  Pt able to recall hip precautions.   Follow Up Recommendations  Home health PT     Equipment Recommendations  Rolling walker with 5" wheels;3in1 (PT)    Recommendations for Other Services       Precautions / Restrictions Precautions Precautions: Posterior Hip;Fall Restrictions Weight Bearing Restrictions: Yes RLE Weight Bearing: Weight bearing as tolerated    Mobility  Bed Mobility Overal bed mobility: Needs Assistance Bed Mobility: Supine to Sit     Supine to sit: Min guard        Transfers Overall transfer level: Needs assistance Equipment used: Rolling walker (2 wheeled) Transfers: Sit to/from Stand Sit to Stand: Supervision Stand pivot transfers: Min guard          Ambulation/Gait Ambulation/Gait assistance: Supervision Ambulation Distance (Feet): 200 Feet Assistive device: Rolling walker (2 wheeled) Gait Pattern/deviations: Step-to pattern;Step-through pattern Gait velocity: decreased Gait velocity interpretation: Below normal speed for age/gender General Gait Details: progressing to step-through pattern during session   Stairs Stairs: Yes   Stair Management: Two rails Number of Stairs: 4 General stair comments: completed stair training and verbal review of  curbs  Wheelchair Mobility    Modified Rankin (Stroke Patients Only)       Balance Overall balance assessment: Needs assistance Sitting-balance support: No upper extremity supported;Feet supported       Standing balance support: No upper extremity supported Standing balance-Leahy Scale: Fair Standing balance comment: able to stand static no UE support (SBA)                            Cognition Arousal/Alertness: Awake/alert Behavior During Therapy: WFL for tasks assessed/performed Overall Cognitive Status: Within Functional Limits for tasks assessed                                        Exercises Total Joint Exercises Ankle Circles/Pumps: AROM;Strengthening;Both;10 reps;Supine Quad Sets: AROM;Strengthening;Both;10 reps;Supine Gluteal Sets: AROM;Strengthening;Both;10 reps;Supine Towel Squeeze: AROM;Strengthening;Both;10 reps;Supine Short Arc Quad: AROM;Strengthening;Right;10 reps;Supine Heel Slides: AAROM;Strengthening;Right;10 reps;Supine Hip ABduction/ADduction: AAROM;Strengthening;Right;10 reps;Supine Straight Leg Raises: AAROM;10 reps;Supine;Right    General Comments        Pertinent Vitals/Pain Pain Assessment: 0-10 Pain Score: 2  Pain Location: Right Hip Pain Descriptors / Indicators: Sore Pain Intervention(s): Limited activity within patient's tolerance;Premedicated before session    Home Living                      Prior Function            PT Goals (current goals can now be found in the care plan section) Progress towards PT goals: Progressing toward goals  Frequency    BID      PT Plan Current plan remains appropriate    Co-evaluation              AM-PAC PT "6 Clicks" Daily Activity  Outcome Measure  Difficulty turning over in bed (including adjusting bedclothes, sheets and blankets)?: A Little Difficulty moving from lying on back to sitting on the side of the bed? : A Little Difficulty  sitting down on and standing up from a chair with arms (e.g., wheelchair, bedside commode, etc,.)?: A Little Help needed moving to and from a bed to chair (including a wheelchair)?: A Little Help needed walking in hospital room?: A Little Help needed climbing 3-5 steps with a railing? : A Little 6 Click Score: 18    End of Session Equipment Utilized During Treatment: Gait belt Activity Tolerance: Patient tolerated treatment well Patient left: in chair;with call bell/phone within reach;with chair alarm set;with family/visitor present Nurse Communication: Patient requests pain meds Pain - Right/Left: Right Pain - part of body: Hip     Time: 9518-8416 PT Time Calculation (min) (ACUTE ONLY): 18 min  Charges:  $Gait Training: 8-22 mins                    G Codes:       Chesley Noon, PTA 09/27/17, 11:02 AM

## 2017-09-27 NOTE — Progress Notes (Signed)
   Subjective: 3 Days Post-Op Procedure(s) (LRB): TOTAL HIP ARTHROPLASTY (Right) Patient reports pain as mild.   Patient is well, and has had no acute complaints or problems Denies any CP, SOB, ABD pain. We will continue therapy today.  Plan is to go Home after hospital stay.  Objective: Vital signs in last 24 hours: Temp:  [98.2 F (36.8 C)-99.1 F (37.3 C)] 99.1 F (37.3 C) (03/02 0733) Pulse Rate:  [106-117] 117 (03/02 0733) Resp:  [16-18] 18 (03/02 0733) BP: (116-149)/(68-76) 123/76 (03/02 0733) SpO2:  [91 %-99 %] 99 % (03/02 0733)  Intake/Output from previous day: 03/01 0701 - 03/02 0700 In: 1661.7 [P.O.:840; I.V.:821.7] Out: 1000 [Urine:1000] Intake/Output this shift: No intake/output data recorded.  Recent Labs    09/25/17 0700  HGB 11.3*   Recent Labs    09/25/17 0700  HCT 33.7*   Recent Labs    09/25/17 0700  NA 141  K 4.3  CL 108  CO2 26  BUN 13  CREATININE 0.96  GLUCOSE 118*  CALCIUM 8.9   No results for input(s): LABPT, INR in the last 72 hours.  EXAM General - Patient is Alert, Appropriate and Oriented Extremity - Neurovascular intact Sensation intact distally Intact pulses distally Dorsiflexion/Plantar flexion intact No cellulitis present Compartment soft Dressing - dressing C/D/I and no drainage Motor Function - intact, moving foot and toes well on exam.   Past Medical History:  Diagnosis Date  . Arthritis   . Cardiac arrhythmia   . Dysrhythmia   . Esophageal stricture   . GERD (gastroesophageal reflux disease)   . Hiatal hernia   . HLD (hyperlipidemia)     Assessment/Plan:   3 Days Post-Op Procedure(s) (LRB): TOTAL HIP ARTHROPLASTY (Right) Active Problems:   Status post total replacement of hip  Estimated body mass index is 26.63 kg/m as calculated from the following:   Height as of this encounter: 5\' 5"  (1.651 m).   Weight as of this encounter: 72.6 kg (160 lb). Discharge home with home health pending completion of  stairs with PT Follow up with [place] [facility] in 6 weeks  DVT Prophylaxis - Lovenox, Foot Pumps and TED hose Weight-Bearing as tolerated to right leg   T. Rachelle Hora, PA-C [place] 09/27/2017, 9:33 AM

## 2017-09-27 NOTE — Progress Notes (Signed)
Patient is being discharged to home with [facility]/PT. DC & RX instructions given and patient acknowledged understanding. IV removed. Granddaughter here to transport patient home.

## 2017-09-27 NOTE — Progress Notes (Signed)
Occupational Therapy Treatment Patient Details Name: Shannon Hendrix MRN: [id number] DOB: 21-Apr-1934 Today's Date: 09/27/2017    History of present illness Pt is an 84 y.o. female s/p R THA posterior approach 09/24/17.  PMH includes hiatal hernia and cardiac arrhythmia.   OT comments  Pt show progress in using AE donning and doffing LB clothing - needed min A because of tights for pants and  Ted hose - but pt show understanding and carry over of hip precautions into IADL' - discuss with pt and daughter . Pt has BSC and will sponge bath until staples or stitches come out - pt report there will be somebody at home most of the time with her .  Follow Up Recommendations       Equipment Recommendations       Recommendations for Other Services      Precautions / Restrictions Precautions Precautions: Posterior Hip Restrictions Weight Bearing Restrictions: Yes RLE Weight Bearing: Weight bearing as tolerated       Mobility Bed Mobility Overal bed mobility: Needs Assistance Bed Mobility: Supine to Sit     Supine to sit: Min guard        Transfers Overall transfer level: Needs assistance Equipment used: Rolling walker (2 wheeled) Transfers: Sit to/from Stand Sit to Stand: Supervision Stand pivot transfers: Min guard            Balance Overall balance assessment: Needs assistance Sitting-balance support: No upper extremity supported;Feet supported       Standing balance support: No upper extremity supported Standing balance-Leahy Scale: Fair Standing balance comment: able to stand static no UE support (SBA)                           ADL either performed or assessed with clinical judgement   ADL                                               Vision       Perception     Praxis      Cognition Arousal/Alertness: Awake/alert Behavior During Therapy: WFL for tasks assessed/performed Overall Cognitive Status: Within Functional  Limits for tasks assessed                                          Exercises Total Joint Exercises Ankle Circles/Pumps: AROM;Strengthening;Both;10 reps;Supine Quad Sets: AROM;Strengthening;Both;10 reps;Supine Gluteal Sets: AROM;Strengthening;Both;10 reps;Supine Towel Squeeze: AROM;Strengthening;Both;10 reps;Supine Short Arc Quad: AROM;Strengthening;Right;10 reps;Supine Heel Slides: AAROM;Strengthening;Right;10 reps;Supine Hip ABduction/ADduction: AAROM;Strengthening;Right;10 reps;Supine Straight Leg Raises: AAROM;10 reps;Supine;Right Other Exercises Other Exercises: UB dressing Independent with setup  Other Exercises: LB dressing using sockaid and reacher for pants , socks and shoes with min A - because of tedhose and pt had tights for pants - harder to get on , under pants was Supervisition and socks using sockaid  Other Exercises: Daughter present - pt has reacher and long shoe horn - need sock aid - ed on where to get it  Other Exercises: discuss hip precautions - pt able to verbalize 3/3 - and ed pt on following internal rotation during standing at sink - turn feet and walker and putting on shoes and pants - use reacher - do not turn toes  Shoulder Instructions       General Comments      Pertinent Vitals/ Pain       Pain Assessment: 0-10 Pain Score: 2  Pain Location: Right Hip Pain Descriptors / Indicators: Sore Pain Intervention(s): Limited activity within patient's tolerance;Premedicated before session  Home Living                                          Prior Functioning/Environment              Frequency           Progress Toward Goals  OT Goals(current goals can now be found in the care plan section)        Plan      Co-evaluation                 AM-PAC PT "6 Clicks" Daily Activity     Outcome Measure                    End of Session        Activity Tolerance     Patient Left      Nurse Communication          Time: 1013-1040 OT Time Calculation (min): 27 min  Charges: OT Treatments $Self Care/Home Management : 23-37 mins     Christeena Krogh OTR/L,CLT 09/27/2017, 11:42 AM

## 2017-09-27 NOTE — Care Management Note (Signed)
Case Management Note  Patient Details  Name: Shannon Hendrix MRN: [id number] Date of Birth: 01-30-1934  Subjective/Objective:    Request for HH=PT called to Alwyn Ren at [facility].                 Action/Plan:   Expected Discharge Date:  09/27/17               Expected Discharge Plan:  [place]  In-House Referral:     Discharge planning Services  CM Consult  Post Acute Care Choice:  Home Health Choice offered to:  Patient  DME Arranged:    DME Agency:     HH Arranged:  PT [place]:  [facility] (formerly [facility])  Status of Service:  Completed, signed off  If discussed at [employer], dates discussed:    Additional Comments:  Preesha Benjamin A, RN 09/27/2017, 10:40 AM

## 2017-11-06 ENCOUNTER — Other Ambulatory Visit: Payer: Medicare Other

## 2017-11-13 ENCOUNTER — Ambulatory Visit
Admission: RE | Admit: 2017-11-13 | Discharge: 2017-11-13 | Disposition: A | Discharge status: Home / Self Care | Payer: Medicare Other | Source: Ambulatory Visit | Attending: Internal Medicine | Admitting: Internal Medicine

## 2017-11-13 DIAGNOSIS — N6002 Solitary cyst of left breast: Secondary | ICD-10-CM | POA: Diagnosis not present

## 2018-02-10 ENCOUNTER — Other Ambulatory Visit: Payer: Self-pay | Admitting: Internal Medicine

## 2018-02-10 DIAGNOSIS — N6002 Solitary cyst of left breast: Secondary | ICD-10-CM

## 2018-05-20 ENCOUNTER — Ambulatory Visit
Admission: RE | Admit: 2018-05-20 | Discharge: 2018-05-20 | Disposition: A | Discharge status: Home / Self Care | Payer: Medicare Other | Source: Ambulatory Visit | Attending: Internal Medicine | Admitting: Internal Medicine

## 2018-05-20 DIAGNOSIS — N6002 Solitary cyst of left breast: Secondary | ICD-10-CM

## 2018-08-18 ENCOUNTER — Other Ambulatory Visit: Payer: Self-pay | Admitting: Internal Medicine

## 2018-08-18 ENCOUNTER — Other Ambulatory Visit (HOSPITAL_COMMUNITY): Payer: Self-pay | Admitting: Internal Medicine

## 2018-08-18 DIAGNOSIS — R519 Headache, unspecified: Secondary | ICD-10-CM

## 2018-08-18 DIAGNOSIS — R51 Headache: Principal | ICD-10-CM

## 2018-08-25 ENCOUNTER — Ambulatory Visit
Admission: RE | Admit: 2018-08-25 | Discharge: 2018-08-25 | Disposition: A | Discharge status: Home / Self Care | Payer: Medicare Other | Source: Ambulatory Visit | Attending: Internal Medicine | Admitting: Internal Medicine

## 2018-08-25 DIAGNOSIS — R51 Headache: Secondary | ICD-10-CM | POA: Insufficient documentation

## 2018-08-25 DIAGNOSIS — R519 Headache, unspecified: Secondary | ICD-10-CM

## 2019-04-22 ENCOUNTER — Other Ambulatory Visit: Payer: Self-pay | Admitting: Internal Medicine

## 2019-04-22 DIAGNOSIS — R928 Other abnormal and inconclusive findings on diagnostic imaging of breast: Secondary | ICD-10-CM

## 2019-04-22 DIAGNOSIS — N6320 Unspecified lump in the left breast, unspecified quadrant: Secondary | ICD-10-CM

## 2019-05-26 ENCOUNTER — Ambulatory Visit
Admission: RE | Admit: 2019-05-26 | Discharge: 2019-05-26 | Disposition: A | Discharge status: Home / Self Care | Payer: Medicare Other | Source: Ambulatory Visit | Attending: Internal Medicine | Admitting: Internal Medicine

## 2019-05-26 DIAGNOSIS — R928 Other abnormal and inconclusive findings on diagnostic imaging of breast: Secondary | ICD-10-CM | POA: Diagnosis not present

## 2020-04-06 ENCOUNTER — Other Ambulatory Visit: Payer: Self-pay | Admitting: Internal Medicine

## 2020-04-06 DIAGNOSIS — Z1231 Encounter for screening mammogram for malignant neoplasm of breast: Secondary | ICD-10-CM

## 2020-05-26 ENCOUNTER — Ambulatory Visit
Admission: RE | Admit: 2020-05-26 | Discharge: 2020-05-26 | Disposition: A | Discharge status: Home / Self Care | Payer: Medicare Other | Source: Ambulatory Visit | Attending: Internal Medicine | Admitting: Internal Medicine

## 2020-05-26 ENCOUNTER — Other Ambulatory Visit: Payer: Self-pay

## 2020-05-26 DIAGNOSIS — Z1231 Encounter for screening mammogram for malignant neoplasm of breast: Secondary | ICD-10-CM | POA: Diagnosis present

## 2021-04-19 ENCOUNTER — Other Ambulatory Visit: Payer: Self-pay | Admitting: Internal Medicine

## 2021-04-19 DIAGNOSIS — Z1231 Encounter for screening mammogram for malignant neoplasm of breast: Secondary | ICD-10-CM

## 2021-05-30 ENCOUNTER — Ambulatory Visit
Admission: RE | Admit: 2021-05-30 | Discharge: 2021-05-30 | Disposition: A | Discharge status: Home / Self Care | Payer: Medicare Other | Source: Ambulatory Visit | Attending: Internal Medicine | Admitting: Internal Medicine

## 2021-05-30 ENCOUNTER — Other Ambulatory Visit: Payer: Self-pay

## 2021-05-30 DIAGNOSIS — Z1231 Encounter for screening mammogram for malignant neoplasm of breast: Secondary | ICD-10-CM

## 2021-11-16 ENCOUNTER — Encounter: Payer: Self-pay | Admitting: Radiology

## 2021-11-16 ENCOUNTER — Inpatient Hospital Stay
Admission: EM | Admit: 2021-11-16 | Discharge: 2021-11-18 | DRG: 310 | Disposition: A | Discharge status: Home / Self Care | Payer: Medicare Other | Attending: Internal Medicine | Admitting: Internal Medicine

## 2021-11-16 ENCOUNTER — Other Ambulatory Visit: Payer: Self-pay

## 2021-11-16 ENCOUNTER — Emergency Department: Payer: Medicare Other

## 2021-11-16 DIAGNOSIS — E669 Obesity, unspecified: Secondary | ICD-10-CM | POA: Diagnosis present

## 2021-11-16 DIAGNOSIS — M810 Age-related osteoporosis without current pathological fracture: Secondary | ICD-10-CM | POA: Diagnosis present

## 2021-11-16 DIAGNOSIS — R0602 Shortness of breath: Secondary | ICD-10-CM

## 2021-11-16 DIAGNOSIS — K219 Gastro-esophageal reflux disease without esophagitis: Secondary | ICD-10-CM | POA: Diagnosis present

## 2021-11-16 DIAGNOSIS — Z79899 Other long term (current) drug therapy: Secondary | ICD-10-CM

## 2021-11-16 DIAGNOSIS — F32A Depression, unspecified: Secondary | ICD-10-CM | POA: Diagnosis present

## 2021-11-16 DIAGNOSIS — Z8249 Family history of ischemic heart disease and other diseases of the circulatory system: Secondary | ICD-10-CM

## 2021-11-16 DIAGNOSIS — E876 Hypokalemia: Secondary | ICD-10-CM | POA: Diagnosis present

## 2021-11-16 DIAGNOSIS — Z96641 Presence of right artificial hip joint: Secondary | ICD-10-CM | POA: Diagnosis present

## 2021-11-16 DIAGNOSIS — I493 Ventricular premature depolarization: Secondary | ICD-10-CM | POA: Diagnosis present

## 2021-11-16 DIAGNOSIS — I4891 Unspecified atrial fibrillation: Secondary | ICD-10-CM | POA: Diagnosis not present

## 2021-11-16 DIAGNOSIS — Z87891 Personal history of nicotine dependence: Secondary | ICD-10-CM

## 2021-11-16 DIAGNOSIS — M1990 Unspecified osteoarthritis, unspecified site: Secondary | ICD-10-CM | POA: Diagnosis present

## 2021-11-16 DIAGNOSIS — E785 Hyperlipidemia, unspecified: Secondary | ICD-10-CM | POA: Diagnosis present

## 2021-11-16 DIAGNOSIS — Z6827 Body mass index (BMI) 27.0-27.9, adult: Secondary | ICD-10-CM

## 2021-11-16 DIAGNOSIS — Z66 Do not resuscitate: Secondary | ICD-10-CM | POA: Diagnosis present

## 2021-11-16 DIAGNOSIS — Z809 Family history of malignant neoplasm, unspecified: Secondary | ICD-10-CM

## 2021-11-16 DIAGNOSIS — Z888 Allergy status to other drugs, medicaments and biological substances status: Secondary | ICD-10-CM

## 2021-11-16 DIAGNOSIS — I700 Atherosclerosis of aorta: Secondary | ICD-10-CM | POA: Diagnosis present

## 2021-11-16 LAB — BASIC METABOLIC PANEL
Anion gap: 11 (ref 5–15)
BUN: 10 mg/dL (ref 8–23)
CO2: 24 mmol/L (ref 22–32)
Calcium: 9 mg/dL (ref 8.9–10.3)
Chloride: 104 mmol/L (ref 98–111)
Creatinine, Ser: 0.88 mg/dL (ref 0.44–1.00)
GFR, Estimated: >60 mL/min (ref >60)
Glucose, Bld: 160 mg/dL — ABNORMAL HIGH (ref 70–99)
Potassium: 3.2 mmol/L — ABNORMAL LOW (ref 3.5–5.1)
Sodium: 139 mmol/L (ref 135–145)

## 2021-11-16 LAB — CBC
HCT: 44.7 % (ref 36.0–46.0)
Hemoglobin: 14.7 g/dL (ref 12.0–15.0)
MCH: 29.5 pg (ref 26.0–34.0)
MCHC: 32.9 g/dL (ref 30.0–36.0)
MCV: 89.8 fL (ref 80.0–100.0)
Platelets: 201 10*3/uL (ref 150–400)
RBC: 4.98 MIL/uL (ref 3.87–5.11)
RDW: 12.8 % (ref 11.5–15.5)
WBC: 14.2 10*3/uL — ABNORMAL HIGH (ref 4.0–10.5)
nRBC: 0 % (ref 0.0–0.2)

## 2021-11-16 LAB — TROPONIN I (HIGH SENSITIVITY): Troponin I (High Sensitivity): 21 ng/L — ABNORMAL HIGH (ref <18)

## 2021-11-16 MED ORDER — DILTIAZEM HCL 25 MG/5ML IV SOLN
10.0000 mg | Freq: Once | INTRAVENOUS | Status: AC
  Administered 2021-11-16: 10 mg via INTRAVENOUS
  Filled 2021-11-16: qty 5

## 2021-11-16 NOTE — ED Provider Notes (Signed)
? ?[facility] ?Provider Note ? ? ? Event Date/Time  ? First MD Initiated Contact with Patient 11/16/21 2343   ?  (approximate) ? ? ?History  ? ?Shortness of Breath ? ? ?HPI ? ?Shannon Hendrix is a 88 y.o. female who presents to the ED from home with a chief complaint of palpitations and shortness of breath when lying down.  History of PAC, CKD, hyperlipidemia.  States she had a migraine yesterday and was vomiting.  Tonight she feels like her throat is closing in on her with palpitations when she lays flat.  Denies fever, cough, chest pain, abdominal pain, diarrhea. ?  ? ? ?Past Medical History  ? ?Past Medical History:  ?Diagnosis Date  ? Arthritis   ? Cardiac arrhythmia   ? Dysrhythmia   ? Esophageal stricture   ? GERD (gastroesophageal reflux disease)   ? Hiatal hernia   ? HLD (hyperlipidemia)   ? ? ? ?Active Problem List  ? ?Patient Active Problem List  ? Diagnosis Date Noted  ? Status post total replacement of hip 09/24/2017  ? Psoriasis, unspecified 08/11/2017  ? Osteoporosis, post-menopausal 08/11/2017  ? GERD (gastroesophageal reflux disease) 08/11/2017  ? Hyperglycemia, unspecified 08/11/2017  ? Hyperlipidemia, unspecified 08/11/2017  ? Rectal bleed 01/19/2017  ? OA (osteoarthritis) of hip 02/18/2014  ? ? ? ?Past Surgical History  ? ?Past Surgical History:  ?Procedure Laterality Date  ? ABDOMINAL HYSTERECTOMY    ? BREAST BIOPSY Left 04/18/14  ? BENIGN BREAST TISSUE WITH NODULAR FIBROSIS.   ? EYE SURGERY Bilateral   ? TOTAL HIP ARTHROPLASTY Right 09/24/2017  ? Procedure: TOTAL HIP ARTHROPLASTY;  Surgeon: Dereck Leep, MD;  Location: [facility] ORS;  Service: Orthopedics;  Laterality: Right;  ? ? ? ?Home Medications  ? ?Prior to Admission medications   ?Medication Sig Start Date End Date Taking? Authorizing Provider  ?acetaminophen (TYLENOL) 500 MG tablet Take 1,000 mg by mouth every 6 (six) hours as needed (for pain.).    [provider]  ?amitriptyline (ELAVIL) 50 MG tablet Take  50 mg by mouth at bedtime.    [provider]  ?Cholecalciferol (VITAMIN D-3) 5000 units TABS Take 5,000 Units by mouth daily.    [provider]  ?enoxaparin (LOVENOX) 40 MG/0.4ML injection Inject 0.4 mLs (40 mg total) into the skin daily for 14 days. 09/27/17 10/11/17  Watt Climes, PA  ?lovastatin (MEVACOR) 20 MG tablet Take 20 mg by mouth at bedtime.    [provider]  ?metoprolol tartrate (LOPRESSOR) 25 MG tablet Take 25 mg by mouth 2 (two) times daily.    [provider]  ?Multiple Vitamin (MULTIVITAMIN WITH MINERALS) TABS tablet Take 1 tablet by mouth daily.    [provider]  ?Omega-3 Fatty Acids (FISH OIL) 1000 MG CAPS Take 2 capsules by mouth 2 (two) times daily.     [provider]  ?omeprazole (PRILOSEC) 20 MG capsule Take 20 mg by mouth daily.    [provider]  ?oxyCODONE (OXY IR/ROXICODONE) 5 MG immediate release tablet Take 1 tablet (5 mg total) by mouth every 3 (three) hours as needed for moderate pain ((score 4 to 6)). 09/25/17   Watt Climes, PA  ?traMADol (ULTRAM) 50 MG tablet Take 1-2 tablets (50-100 mg total) by mouth every 4 (four) hours as needed for moderate pain. 09/25/17   Watt Climes, PA  ?vitamin B-12 (CYANOCOBALAMIN) 1000 MCG tablet Take 1,000 mcg by mouth daily.    [provider]  ? ? ? ?Allergies  ?Nitrofurantoin ? ? ?Family History  ? ?Family History  ?Problem Relation Age of Onset  ? Bone cancer Sister   ? Heart attack Brother   ? Breast cancer Neg Hx   ? ? ? ?Physical Exam  ?Triage Vital Signs: ?ED Triage Vitals [11/16/21 2316]  ?Enc Vitals Group  ?   BP (!) 157/103  ?   Pulse Rate 95  ?   Resp 18  ?   Temp 99.1 ?F (37.3 ?C)  ?   Temp Source Oral  ?   SpO2 98 %  ?   Weight 165 lb (74.8 kg)  ?   Height '5\' 5"'$  (1.651 m)  ?   Head Circumference   ?   Peak Flow   ?   Pain Score 0  ?   Pain Loc   ?   Pain Edu?   ?   Excl. in [place]?   ? ? ?Updated Vital Signs: ?BP 98/65   Pulse (!) 142   Temp 99.1 ?F (37.3 ?C) (Oral)    Resp 20   Ht '5\' 5"'$  (1.651 m)   Wt 74.8 kg   SpO2 90%   BMI 27.46 kg/m?  ? ? ?General: Awake, mild distress.  ?CV:  Tachycardic, irregularly irregular rhythm.  Good peripheral perfusion.  ?Resp:  Normal effort.  CTA B. ?Abd:  Nontender.  No distention.  ?Other:  No thyromegaly.  Calves are nontender and nonswollen. ? ? ?ED Results / Procedures / Treatments  ?Labs ?(all labs ordered are listed, but only abnormal results are displayed) ?Labs Reviewed  ?BASIC METABOLIC PANEL - Abnormal; Notable for the following components:  ?    Result Value  ? Potassium 3.2 (*)   ? Glucose, Bld 160 (*)   ? All other components within normal limits  ?CBC - Abnormal; Notable for the following components:  ? WBC 14.2 (*)   ? All other components within normal limits  ?TROPONIN I (HIGH SENSITIVITY) - Abnormal; Notable for the following components:  ? Troponin I (High Sensitivity) 21 (*)   ? All other components within normal limits  ?TSH  ?T4, FREE  ? ? ? ?EKG ? ?ED ECG REPORT ?I, Paulette Blanch, the attending physician, personally viewed and interpreted this ECG. ? ? Date: 11/17/2021 ? EKG Time: 2323 ? Rate: 164 ? Rhythm: atrial fibrillation, rate 164 ? Axis: Normal ? Intervals:none ? ST&T Change: Nonspecific ?New atrial fibrillation compared to EKG from 01/21/2017 ? ? ?RADIOLOGY ?I have independently visualized and interpreted patient's chest x-ray as well as noted the radiology interpretation: ? ?Chest x-ray: No acute cardiopulmonary process ? ?Official radiology report(s): ?DG Chest 1 View ? ?Result Date: 11/16/2021 ?CLINICAL DATA:  shob EXAM: CHEST  1 VIEW COMPARISON:  None. FINDINGS: The heart and mediastinal contours are within normal limits. Aortic calcification. No focal consolidation. Coarsened markings with no overt pulmonary edema. No pleural effusion. No pneumothorax. No acute osseous abnormality.  Old healed right rib fractures. IMPRESSION: 1. No active disease. 2.  Aortic Atherosclerosis (ICD10-I70.0). Electronically  Signed   By: Iven Finn M.D.   On: 11/16/2021 23:58   ? ? ?PROCEDURES: ? ?Critical Care performed: Yes, see critical care procedure note(s) ? ?CRITICAL CARE ?Performed by: Paulette Blanch ? ? ?Total critical care time: 45 minutes ? ?Critical care time was exclusive of separately billable procedures and treating other patients. ? ?Critical care was necessary to treat or prevent imminent or life-threatening deterioration. ? ?Critical care  was time spent personally by me on the following activities: development of treatment plan with patient and/or surrogate as well as nursing, discussions with consultants, evaluation of patient's response to treatment, examination of patient, obtaining history from patient or surrogate, ordering and performing treatments and interventions, ordering and review of laboratory studies, ordering and review of radiographic studies, pulse oximetry and re-evaluation of patient's condition. ? ? ?.1-3 Lead EKG Interpretation ?Performed by: Paulette Blanch, MD ?Authorized by: Paulette Blanch, MD  ? ?  Interpretation: abnormal   ?  ECG rate:  166 ?  ECG rate assessment: tachycardic   ?  Rhythm: atrial fibrillation   ?  Ectopy: none   ?  Conduction: normal   ?Comments:  ?   Patient is not on cardiac monitor to evaluate for arrhythmias ? ? ?MEDICATIONS ORDERED IN ED: ?Medications  ?sodium chloride 0.9 % bolus 500 mL (has no administration in time range)  ?calcium gluconate 1 g/ 50 mL sodium chloride IVPB (has no administration in time range)  ?diltiazem (CARDIZEM) 125 mg in dextrose 5% 125 mL (1 mg/mL) infusion (has no administration in time range)  ?potassium chloride SA (KLOR-CON M) CR tablet 40 mEq (has no administration in time range)  ?diltiazem (CARDIZEM) injection 10 mg (10 mg Intravenous Given 11/16/21 2358)  ? ? ? ?IMPRESSION / MDM / ASSESSMENT AND PLAN / ED COURSE  ?I reviewed the triage vital signs and the nursing notes. ?             ?               ?88 year old female presenting with  shortness of breath and palpitations. Differential includes, but is not limited to, viral syndrome, bronchitis including COPD exacerbation, pneumonia, reactive airway disease including asthma, CHF including exacerbation

## 2021-11-16 NOTE — ED Triage Notes (Signed)
Pt states has shortness of breath when lying down. Pt states has worsened tonight. Pt states she feels like her throat is "closing" when she lies down. Pt states was also vomiting yesterday.  ?

## 2021-11-16 NOTE — ED Notes (Signed)
While completing ekg, pt into a fib with RVR, rate in 160s-170s. Pt taken to room 10. ?

## 2021-11-17 ENCOUNTER — Inpatient Hospital Stay
Admit: 2021-11-17 | Discharge: 2021-11-17 | Disposition: A | Discharge status: Home / Self Care | Payer: Medicare Other | Attending: Family Medicine | Admitting: Family Medicine

## 2021-11-17 ENCOUNTER — Encounter: Payer: Self-pay | Admitting: Family Medicine

## 2021-11-17 DIAGNOSIS — Z809 Family history of malignant neoplasm, unspecified: Secondary | ICD-10-CM | POA: Diagnosis not present

## 2021-11-17 DIAGNOSIS — M810 Age-related osteoporosis without current pathological fracture: Secondary | ICD-10-CM | POA: Diagnosis present

## 2021-11-17 DIAGNOSIS — R0602 Shortness of breath: Secondary | ICD-10-CM | POA: Diagnosis present

## 2021-11-17 DIAGNOSIS — M1990 Unspecified osteoarthritis, unspecified site: Secondary | ICD-10-CM | POA: Diagnosis present

## 2021-11-17 DIAGNOSIS — E876 Hypokalemia: Secondary | ICD-10-CM | POA: Diagnosis present

## 2021-11-17 DIAGNOSIS — F32A Depression, unspecified: Secondary | ICD-10-CM

## 2021-11-17 DIAGNOSIS — I4891 Unspecified atrial fibrillation: Secondary | ICD-10-CM | POA: Diagnosis present

## 2021-11-17 DIAGNOSIS — Z66 Do not resuscitate: Secondary | ICD-10-CM | POA: Diagnosis present

## 2021-11-17 DIAGNOSIS — I700 Atherosclerosis of aorta: Secondary | ICD-10-CM | POA: Diagnosis present

## 2021-11-17 DIAGNOSIS — Z8249 Family history of ischemic heart disease and other diseases of the circulatory system: Secondary | ICD-10-CM | POA: Diagnosis not present

## 2021-11-17 DIAGNOSIS — E669 Obesity, unspecified: Secondary | ICD-10-CM | POA: Diagnosis present

## 2021-11-17 DIAGNOSIS — Z6827 Body mass index (BMI) 27.0-27.9, adult: Secondary | ICD-10-CM | POA: Diagnosis not present

## 2021-11-17 DIAGNOSIS — E785 Hyperlipidemia, unspecified: Secondary | ICD-10-CM

## 2021-11-17 DIAGNOSIS — Z96641 Presence of right artificial hip joint: Secondary | ICD-10-CM | POA: Diagnosis present

## 2021-11-17 DIAGNOSIS — K219 Gastro-esophageal reflux disease without esophagitis: Secondary | ICD-10-CM | POA: Diagnosis present

## 2021-11-17 DIAGNOSIS — Z79899 Other long term (current) drug therapy: Secondary | ICD-10-CM | POA: Diagnosis not present

## 2021-11-17 DIAGNOSIS — I493 Ventricular premature depolarization: Secondary | ICD-10-CM | POA: Diagnosis present

## 2021-11-17 DIAGNOSIS — Z888 Allergy status to other drugs, medicaments and biological substances status: Secondary | ICD-10-CM | POA: Diagnosis not present

## 2021-11-17 DIAGNOSIS — Z87891 Personal history of nicotine dependence: Secondary | ICD-10-CM | POA: Diagnosis not present

## 2021-11-17 LAB — BASIC METABOLIC PANEL
Anion gap: 7 (ref 5–15)
BUN: 8 mg/dL (ref 8–23)
CO2: 24 mmol/L (ref 22–32)
Calcium: 8.5 mg/dL — ABNORMAL LOW (ref 8.9–10.3)
Chloride: 104 mmol/L (ref 98–111)
Creatinine, Ser: 0.73 mg/dL (ref 0.44–1.00)
GFR, Estimated: >60 mL/min (ref >60)
Glucose, Bld: 173 mg/dL — ABNORMAL HIGH (ref 70–99)
Potassium: 3.4 mmol/L — ABNORMAL LOW (ref 3.5–5.1)
Sodium: 135 mmol/L (ref 135–145)

## 2021-11-17 LAB — CBC
HCT: 41.2 % (ref 36.0–46.0)
Hemoglobin: 13.5 g/dL (ref 12.0–15.0)
MCH: 29.2 pg (ref 26.0–34.0)
MCHC: 32.8 g/dL (ref 30.0–36.0)
MCV: 89 fL (ref 80.0–100.0)
Platelets: 189 10*3/uL (ref 150–400)
RBC: 4.63 MIL/uL (ref 3.87–5.11)
RDW: 12.7 % (ref 11.5–15.5)
WBC: 16.6 10*3/uL — ABNORMAL HIGH (ref 4.0–10.5)
nRBC: 0 % (ref 0.0–0.2)

## 2021-11-17 LAB — ECHOCARDIOGRAM COMPLETE
AR max vel: 1.74 cm2
AV Peak grad: 8.8 mmHg
Ao pk vel: 1.48 m/s
Area-P 1/2: 5.75 cm2
Height: 65 in
S' Lateral: 3.37 cm
Single Plane A4C EF: 60.2 %
Weight: 2640 oz

## 2021-11-17 LAB — T4, FREE: Free T4: 1.22 ng/dL — ABNORMAL HIGH (ref 0.61–1.12)

## 2021-11-17 LAB — TSH: TSH: 1.805 u[IU]/mL (ref 0.350–4.500)

## 2021-11-17 LAB — MAGNESIUM: Magnesium: 2.1 mg/dL (ref 1.7–2.4)

## 2021-11-17 LAB — TROPONIN I (HIGH SENSITIVITY): Troponin I (High Sensitivity): 29 ng/L — ABNORMAL HIGH (ref <18)

## 2021-11-17 MED ORDER — OMEGA-3-ACID ETHYL ESTERS 1 G PO CAPS
2.0000 g | ORAL_CAPSULE | Freq: Two times a day (BID) | ORAL | Status: DC
  Administered 2021-11-17 – 2021-11-18 (×3): 2 g via ORAL
  Filled 2021-11-17 (×3): qty 2

## 2021-11-17 MED ORDER — DILTIAZEM HCL-DEXTROSE 125-5 MG/125ML-% IV SOLN (PREMIX)
5.0000 mg/h | INTRAVENOUS | Status: DC
  Administered 2021-11-17: 5 mg/h via INTRAVENOUS
  Filled 2021-11-17 (×2): qty 125

## 2021-11-17 MED ORDER — APIXABAN 5 MG PO TABS
5.0000 mg | ORAL_TABLET | Freq: Two times a day (BID) | ORAL | Status: DC
  Administered 2021-11-17 – 2021-11-18 (×3): 5 mg via ORAL
  Filled 2021-11-17 (×3): qty 1

## 2021-11-17 MED ORDER — ACETAMINOPHEN 325 MG PO TABS
650.0000 mg | ORAL_TABLET | Freq: Four times a day (QID) | ORAL | Status: DC | PRN
  Administered 2021-11-17: 650 mg via ORAL
  Filled 2021-11-17: qty 2

## 2021-11-17 MED ORDER — SERTRALINE HCL 50 MG PO TABS
25.0000 mg | ORAL_TABLET | Freq: Every day | ORAL | Status: DC
  Administered 2021-11-18: 25 mg via ORAL
  Filled 2021-11-17 (×2): qty 1

## 2021-11-17 MED ORDER — MAGNESIUM HYDROXIDE 400 MG/5ML PO SUSP: 30.0000 mL | Freq: Every day | ORAL | Status: DC | PRN

## 2021-11-17 MED ORDER — AMIODARONE HCL IN DEXTROSE 360-4.14 MG/200ML-% IV SOLN
60.0000 mg/h | INTRAVENOUS | Status: AC
  Administered 2021-11-17 (×2): 60 mg/h via INTRAVENOUS
  Filled 2021-11-17 (×2): qty 200

## 2021-11-17 MED ORDER — SODIUM CHLORIDE 0.9 % IV BOLUS
500.0000 mL | Freq: Once | INTRAVENOUS | Status: AC
  Administered 2021-11-17: 500 mL via INTRAVENOUS

## 2021-11-17 MED ORDER — POTASSIUM CHLORIDE IN NACL 20-0.9 MEQ/L-% IV SOLN
INTRAVENOUS | Status: DC
  Filled 2021-11-17 (×2): qty 1000

## 2021-11-17 MED ORDER — PANTOPRAZOLE SODIUM 40 MG PO TBEC
40.0000 mg | DELAYED_RELEASE_TABLET | Freq: Every day | ORAL | Status: DC
Start: 2021-11-17 — End: 2021-11-18
  Administered 2021-11-17 – 2021-11-18 (×2): 40 mg via ORAL
  Filled 2021-11-17 (×2): qty 1

## 2021-11-17 MED ORDER — ROSUVASTATIN CALCIUM 10 MG PO TABS
10.0000 mg | ORAL_TABLET | Freq: Every day | ORAL | Status: DC
  Administered 2021-11-17 – 2021-11-18 (×2): 10 mg via ORAL
  Filled 2021-11-17 (×2): qty 1

## 2021-11-17 MED ORDER — ACETAMINOPHEN 650 MG RE SUPP: 650.0000 mg | Freq: Four times a day (QID) | RECTAL | Status: DC | PRN

## 2021-11-17 MED ORDER — POTASSIUM CHLORIDE CRYS ER 20 MEQ PO TBCR
40.0000 meq | EXTENDED_RELEASE_TABLET | Freq: Once | ORAL | Status: AC
Start: 2021-11-17 — End: 2021-11-17
  Administered 2021-11-17: 40 meq via ORAL
  Filled 2021-11-17: qty 2

## 2021-11-17 MED ORDER — ADULT MULTIVITAMIN W/MINERALS CH
1.0000 | ORAL_TABLET | Freq: Every day | ORAL | Status: DC
  Administered 2021-11-17 – 2021-11-18 (×2): 1 via ORAL
  Filled 2021-11-17 (×2): qty 1

## 2021-11-17 MED ORDER — CALCIUM GLUCONATE-NACL 1-0.675 GM/50ML-% IV SOLN
1.0000 g | Freq: Once | INTRAVENOUS | Status: AC
  Administered 2021-11-17: 1000 mg via INTRAVENOUS
  Filled 2021-11-17: qty 50

## 2021-11-17 MED ORDER — METOPROLOL TARTRATE 25 MG PO TABS
25.0000 mg | ORAL_TABLET | Freq: Two times a day (BID) | ORAL | Status: DC
  Administered 2021-11-17 – 2021-11-18 (×2): 25 mg via ORAL
  Filled 2021-11-17 (×3): qty 1

## 2021-11-17 MED ORDER — AMIODARONE HCL 200 MG PO TABS
200.0000 mg | ORAL_TABLET | Freq: Two times a day (BID) | ORAL | Status: DC
  Administered 2021-11-17 – 2021-11-18 (×3): 200 mg via ORAL
  Filled 2021-11-17 (×3): qty 1

## 2021-11-17 MED ORDER — AMIODARONE HCL IN DEXTROSE 360-4.14 MG/200ML-% IV SOLN
30.0000 mg/h | INTRAVENOUS | Status: DC
  Administered 2021-11-17 (×2): 30 mg/h via INTRAVENOUS
  Filled 2021-11-17: qty 200

## 2021-11-17 MED ORDER — AMIODARONE LOAD VIA INFUSION
150.0000 mg | Freq: Once | INTRAVENOUS | Status: AC
  Administered 2021-11-17: 150 mg via INTRAVENOUS
  Filled 2021-11-17: qty 83.34

## 2021-11-17 MED ORDER — VITAMIN D3 25 MCG (1000 UNIT) PO TABS
5000.0000 [IU] | ORAL_TABLET | Freq: Every day | ORAL | Status: DC
  Administered 2021-11-17 – 2021-11-18 (×2): 5000 [IU] via ORAL
  Filled 2021-11-17 (×4): qty 5

## 2021-11-17 MED ORDER — TRAZODONE HCL 50 MG PO TABS
25.0000 mg | ORAL_TABLET | Freq: Every evening | ORAL | Status: DC | PRN
  Administered 2021-11-17: 25 mg via ORAL
  Filled 2021-11-17: qty 1

## 2021-11-17 MED ORDER — ONDANSETRON HCL 4 MG/2ML IJ SOLN: 4.0000 mg | Freq: Four times a day (QID) | INTRAMUSCULAR | Status: DC | PRN

## 2021-11-17 MED ORDER — VITAMIN B-12 1000 MCG PO TABS
1000.0000 ug | ORAL_TABLET | Freq: Every day | ORAL | Status: DC
  Administered 2021-11-17 – 2021-11-18 (×2): 1000 ug via ORAL
  Filled 2021-11-17 (×2): qty 1

## 2021-11-17 MED ORDER — ONDANSETRON HCL 4 MG PO TABS: 4.0000 mg | ORAL_TABLET | Freq: Four times a day (QID) | ORAL | Status: DC | PRN

## 2021-11-17 NOTE — ED Notes (Signed)
Assisted pt to toilet, gave pt ice water. ?

## 2021-11-17 NOTE — Assessment & Plan Note (Signed)
-   We will continue her Zoloft. 

## 2021-11-17 NOTE — Progress Notes (Signed)
?PROGRESS NOTE ? ? ? ?Shannon Hendrix  DJM:[id number] DOB: [birth year] DOA: 11/16/2021 ?PCP: Adin Hector, MD  ? ?Assessment & Plan: ?  ?Principal Problem: ?  Atrial fibrillation with rapid ventricular response ([place]) ? ? ?A. fib: w/ RVR. New onset. Continue on IV amio drip and d/c IV cardizem drip. CHA2DS2-VASC score 4. Started and will continue on eliquis. Continue on tele. Cardio consulted ? ?Hypokalemia: potassium given] ? ?Leukocytosis: likely reactive ? ?HLD: continue on statin ? ?Depression: severity unknown. Continue on home dose sertraline  ? ? ? ? ? ?DVT prophylaxis: eliquis  ?Code Status: DNR ?Family Communication: discussed pt's care w/ pt's family at bedside and answered their questions  ?Disposition Plan: likely d/c back home  ? ?Status is: Inpatient ?Remains inpatient appropriate because: on IV amio drip  ? ? ? ?Level of care: Progressive ?Consultants:  ?cardio ? ?Procedures:  ? ?Antimicrobials:  ? ?Subjective: ?Pt c/o palpitations  ? ?Objective: ?Vitals:  ? 11/17/21 0630 11/17/21 0715 11/17/21 0730 11/17/21 0745  ?BP:  105/72 96/71   ?Pulse: 62 (!) 108 66 (!) 105  ?Resp: 16 20 (!) 22 16  ?Temp:      ?TempSrc:      ?SpO2: 95% 94% 93% 94%  ?Weight:      ?Height:      ? ? ?Intake/Output Summary (Last 24 hours) at 11/17/2021 0801 ?Last data filed at 11/17/2021 0128 ?Gross per 24 hour  ?Intake 476.66 ml  ?Output --  ?Net 476.66 ml  ? ?Filed Weights  ? 11/16/21 2316  ?Weight: 74.8 kg  ? ? ?Examination: ? ?General exam: Appears calm and comfortable  ?Respiratory system: Clear to auscultation. Respiratory effort normal. ?Cardiovascular system: irregularly irregular.  No rubs, gallops or clicks. ?Gastrointestinal system: Abdomen is nondistended, soft and nontender. Normal bowel sounds heard. ?Central nervous system: Alert and oriented. Moves all extremities  ?Psychiatry: Judgement and insight appear normal. Mood & affect appropriate.  ? ? ? ?Data Reviewed: I have personally reviewed following labs and  imaging studies ? ?CBC: ?Recent Labs  ?Lab 11/16/21 ?2322 11/17/21 ?2229  ?WBC 14.2* 16.6*  ?HGB 14.7 13.5  ?HCT 44.7 41.2  ?MCV 89.8 89.0  ?PLT 201 189  ? ?Basic Metabolic Panel: ?Recent Labs  ?Lab 11/16/21 ?2322 11/17/21 ?7989  ?NA 139 135  ?K 3.2* 3.4*  ?CL 104 104  ?CO2 24 24  ?GLUCOSE 160* 173*  ?BUN 10 8  ?CREATININE 0.88 0.73  ?CALCIUM 9.0 8.5*  ? ?GFR: ?Estimated Creatinine Clearance: 52 mL/min (by C-G formula based on SCr of 0.73 mg/dL). ?Liver Function Tests: ?No results for input(s): AST, ALT, ALKPHOS, BILITOT, PROT, ALBUMIN in the last 168 hours. ?No results for input(s): LIPASE, AMYLASE in the last 168 hours. ?No results for input(s): AMMONIA in the last 168 hours. ?Coagulation Profile: ?No results for input(s): INR, PROTIME in the last 168 hours. ?Cardiac Enzymes: ?No results for input(s): CKTOTAL, CKMB, CKMBINDEX, TROPONINI in the last 168 hours. ?BNP (last 3 results) ?No results for input(s): PROBNP in the last 8760 hours. ?HbA1C: ?No results for input(s): HGBA1C in the last 72 hours. ?CBG: ?No results for input(s): GLUCAP in the last 168 hours. ?Lipid Profile: ?No results for input(s): CHOL, HDL, LDLCALC, TRIG, CHOLHDL, LDLDIRECT in the last 72 hours. ?Thyroid Function Tests: ?Recent Labs  ?  11/17/21 ?0236  ?TSH 1.805  ?FREET4 1.22*  ? ?Anemia Panel: ?No results for input(s): VITAMINB12, FOLATE, FERRITIN, TIBC, IRON, RETICCTPCT in the last 72 hours. ?Sepsis Labs: ?No results for  input(s): PROCALCITON, LATICACIDVEN in the last 168 hours. ? ?No results found for this or any previous visit (from the past 240 hour(s)).  ? ? ? ? ? ?Radiology Studies: ?DG Chest 1 View ? ?Result Date: 11/16/2021 ?CLINICAL DATA:  shob EXAM: CHEST  1 VIEW COMPARISON:  None. FINDINGS: The heart and mediastinal contours are within normal limits. Aortic calcification. No focal consolidation. Coarsened markings with no overt pulmonary edema. No pleural effusion. No pneumothorax. No acute osseous abnormality.  Old healed right  rib fractures. IMPRESSION: 1. No active disease. 2.  Aortic Atherosclerosis (ICD10-I70.0). Electronically Signed   By: Iven Finn M.D.   On: 11/16/2021 23:58   ? ? ? ? ? ?Scheduled Meds: ? apixaban  5 mg Oral BID  ? cholecalciferol  5,000 Units Oral Daily  ? metoprolol tartrate  25 mg Oral BID  ? multivitamin with minerals  1 tablet Oral Daily  ? omega-3 acid ethyl esters  2 g Oral BID  ? pantoprazole  40 mg Oral Daily  ? rosuvastatin  10 mg Oral Daily  ? sertraline  25 mg Oral Daily  ? vitamin B-12  1,000 mcg Oral Daily  ? ?Continuous Infusions: ? 0.9 % NaCl with KCl 20 mEq / L 100 mL/hr at 11/17/21 0424  ? amiodarone 60 mg/hr (11/17/21 0432)  ? Followed by  ? amiodarone    ? diltiazem (CARDIZEM) infusion 15 mg/hr (11/17/21 0341)  ? ? ? LOS: 0 days  ? ? ?Time spent: 33 mins  ? ? ? ?Wyvonnia Dusky, MD ?[facility] ?Pager [phone] ? ?If 7PM-7AM, please contact night-coverage ?11/17/2021, 8:01 AM  ? ?

## 2021-11-17 NOTE — H&P (Addendum)
?  ?  ?[place] ? ? ?PATIENT NAME: Shannon Hendrix   ? ?MR#:  [id number] ? ?DATE OF BIRTH:  Dec 26, 1933 ? ?DATE OF ADMISSION:  11/16/2021 ? ?PRIMARY CARE PHYSICIAN: Adin Hector, MD  ? ?Patient is coming from: Home ? ?REQUESTING/REFERRING PHYSICIAN: Lurline Hare, MD ? ?CHIEF COMPLAINT:  ? ?Chief Complaint  ?Patient presents with  ? Shortness of Breath  ? ? ?HISTORY OF PRESENT ILLNESS:  ?Shannon Hendrix is a 88 y.o. Caucasian female with medical history significant for GERD, dyslipidemia, esophageal stricture, osteoarthritis, who presented to the emergency room with acute onset of a feeling of throat closing up when she is laying flat and occasional palpitations.  She denied any chest pain or dyspnea or cough or wheezing.  No nausea or vomiting or abdominal pain.  No fever or chills.  No dysuria, oliguria or hematuria or flank pain. ? ?ED Course: When she came to the ER, heart rate was 168 with a BP of 80/58 and later 125/89 with otherwise normal vital signs.  Labs revealed hypokalemia of 3.2 and high-sensitivity troponin of 21 with CBC showing leukocytosis of 14.2.  TSH was 1.805 and free T41.22 ?EKG as reviewed by me : Atrial fibrillation with rapid ventricular sponsor of 164 with PVCs. ?Imaging: Chest x-ray showed aortic atherosclerosis with no acute disease. ? ?The patient was given 10 mg of IV Cardizem bolus followed by drip 1 g of IV calcium gluconate, 500 mill IV normal saline bolus and 40 mill [place] p.o. potassium chloride.  She was still tachycardic despite IV Cardizem drip at 15 mg/h.  I started her on IV amiodarone with bolus and drip.  She will be admitted to a progressive unit bed for further evaluation and management. ?PAST MEDICAL HISTORY:  ? ?Past Medical History:  ?Diagnosis Date  ? Arthritis   ? Cardiac arrhythmia   ? Dysrhythmia   ? Esophageal stricture   ? GERD (gastroesophageal reflux disease)   ? Hiatal hernia   ? HLD (hyperlipidemia)   ? ? ?PAST SURGICAL HISTORY:  ? ?Past Surgical  History:  ?Procedure Laterality Date  ? ABDOMINAL HYSTERECTOMY    ? BREAST BIOPSY Left 04/18/14  ? BENIGN BREAST TISSUE WITH NODULAR FIBROSIS.   ? EYE SURGERY Bilateral   ? TOTAL HIP ARTHROPLASTY Right 09/24/2017  ? Procedure: TOTAL HIP ARTHROPLASTY;  Surgeon: Dereck Leep, MD;  Location: [facility] ORS;  Service: Orthopedics;  Laterality: Right;  ? ? ?SOCIAL HISTORY:  ? ?Social History  ? ?Tobacco Use  ? Smoking status: Former  ?  Packs/day: 0.30  ?  Years: 5.00  ?  Pack years: 1.50  ?  Types: Cigarettes  ?  Quit date: 07/29/1961  ?  Years since quitting: 60.3  ? Smokeless tobacco: Never  ?Substance Use Topics  ? Alcohol use: No  ? ? ?FAMILY HISTORY:  ? ?Family History  ?Problem Relation Age of Onset  ? Bone cancer Sister   ? Heart attack Brother   ? Breast cancer Neg Hx   ? ? ?DRUG ALLERGIES:  ? ?Allergies  ?Allergen Reactions  ? Nitrofurantoin Rash  ? ? ?REVIEW OF SYSTEMS:  ? ?ROS ?As per history of present illness. All pertinent systems were reviewed above. Constitutional, HEENT, cardiovascular, respiratory, GI, GU, musculoskeletal, neuro, psychiatric, endocrine, integumentary and hematologic systems were reviewed and are otherwise negative/unremarkable except for positive findings mentioned above in the HPI. ? ? ?MEDICATIONS AT HOME:  ? ?Prior to Admission medications   ?Medication Sig Start Date  End Date Taking? Authorizing Provider  ?acetaminophen (TYLENOL) 500 MG tablet Take 1,000 mg by mouth every 6 (six) hours as needed (for pain.).   Yes [provider]  ?Cholecalciferol (VITAMIN D-3) 5000 units TABS Take 5,000 Units by mouth daily.   Yes [provider]  ?lovastatin (MEVACOR) 20 MG tablet Take 20 mg by mouth at bedtime.   Yes [provider]  ?metoprolol tartrate (LOPRESSOR) 25 MG tablet Take 25 mg by mouth 2 (two) times daily.   Yes [provider]  ?Multiple Vitamin (MULTIVITAMIN WITH MINERALS) TABS tablet Take 1 tablet by mouth daily.   Yes [provider]   ?nortriptyline (PAMELOR) 25 MG capsule Take 25 mg by mouth at bedtime. 09/11/21  Yes [provider]  ?Omega-3 Fatty Acids (FISH OIL) 1000 MG CAPS Take 2 capsules by mouth 2 (two) times daily.    Yes [provider]  ?omeprazole (PRILOSEC) 20 MG capsule Take 20 mg by mouth daily.   Yes [provider]  ?rosuvastatin (CRESTOR) 10 MG tablet Take 10 mg by mouth daily. 07/06/21  Yes [provider]  ?sertraline (ZOLOFT) 25 MG tablet Take 25 mg by mouth daily. 07/06/21  Yes [provider]  ?vitamin B-12 (CYANOCOBALAMIN) 1000 MCG tablet Take 1,000 mcg by mouth daily.   Yes [provider]  ? ?  ? ?VITAL SIGNS:  ?Blood pressure 96/71, pulse (!) 105, temperature 99.1 ?F (37.3 ?C), temperature source Oral, resp. rate 16, height '5\' 5"'$  (1.651 m), weight 74.8 kg, SpO2 94 %. ? ?PHYSICAL EXAMINATION:  ?Physical Exam ? ?GENERAL:  88 y.o.-year-old Caucasian female patient lying in the bed with no acute distress.  ?EYES: Pupils equal, round, reactive to light and accommodation. No scleral icterus. Extraocular muscles intact.  ?HEENT: Head atraumatic, normocephalic. Oropharynx and nasopharynx clear.  ?NECK:  Supple, no jugular venous distention. No thyroid enlargement, no tenderness.  ?LUNGS: Normal breath sounds bilaterally, no wheezing, rales,rhonchi or crepitation. No use of accessory muscles of respiration.  ?CARDIOVASCULAR: Irregularly irregular tachycardic S1, S2 normal. No murmurs, rubs, or gallops.  ?ABDOMEN: Soft, nondistended, nontender. Bowel sounds present. No organomegaly or mass.  ?EXTREMITIES: No pedal edema, cyanosis, or clubbing.  ?NEUROLOGIC: Cranial nerves II through XII are intact. Muscle strength 5/5 in all extremities. Sensation intact. Gait not checked.  ?PSYCHIATRIC: The patient is alert and oriented x 3.  Normal affect and good eye contact. ?SKIN: No obvious rash, lesion, or ulcer.  ? ?LABORATORY PANEL:  ? ?CBC ?Recent Labs  ?Lab 11/17/21 ?0737  ?WBC 16.6*   ?HGB 13.5  ?HCT 41.2  ?PLT 189  ? ?------------------------------------------------------------------------------------------------------------------ ? ?Chemistries  ?Recent Labs  ?Lab 11/17/21 ?1062  ?NA 135  ?K 3.4*  ?CL 104  ?CO2 24  ?GLUCOSE 173*  ?BUN 8  ?CREATININE 0.73  ?CALCIUM 8.5*  ? ?------------------------------------------------------------------------------------------------------------------ ? ?Cardiac Enzymes ?No results for input(s): TROPONINI in the last 168 hours. ?------------------------------------------------------------------------------------------------------------------ ? ?RADIOLOGY:  ?DG Chest 1 View ? ?Result Date: 11/16/2021 ?CLINICAL DATA:  shob EXAM: CHEST  1 VIEW COMPARISON:  None. FINDINGS: The heart and mediastinal contours are within normal limits. Aortic calcification. No focal consolidation. Coarsened markings with no overt pulmonary edema. No pleural effusion. No pneumothorax. No acute osseous abnormality.  Old healed right rib fractures. IMPRESSION: 1. No active disease. 2.  Aortic Atherosclerosis (ICD10-I70.0). Electronically Signed   By: Iven Finn M.D.   On: 11/16/2021 23:58   ? ? ? ?IMPRESSION AND PLAN:  ?Assessment and Plan: ?* Atrial fibrillation with rapid ventricular  response ([place]) ?- The patient will be admitted to a PCU bed, ?- We will continue him on IV amiodarone and withdraw IV Cardizem gradually. ?- We will follow serial troponins. ?- Cardiology consult and 2D echo will be obtained. ?- I notified Dr. Clayborn Bigness about the patient. ?- We will optimize his hypokalemia ?-Her CHA2DS2-VASc score is 4. ?- She was started on p.o. Eliquis. ? ?Hypokalemia ?- We will replace potassium and check magnesium level ? ?Dyslipidemia ?- We will continue statin therapy ? ?Depression ?- We will continue her Zoloft. ? ? ?DVT prophylaxis: Yes.  Eliquis.  ?Advanced Care Planning:  Code Status: DNR/DNI.  This was discussed with her and her family.Marland Kitchen  ?Family Communication:  The plan  of care was discussed in details with the patient (and family). ?I answered all questions. The patient agreed to proceed with the above mentioned plan. Further management will depend upon hospital course. ?Dispositi

## 2021-11-17 NOTE — Progress Notes (Signed)
Patient  is a yellow MEWS due to her HR being 113. She was admitted with AFIB w/rvr-elevated HR not unusual. Will give scheduled amiodarone and recheck vitals on MEWS schedule. Informed the charge nurse of patient's status. No additional interventions at this time.  ?

## 2021-11-17 NOTE — ED Notes (Signed)
Informed MD of unidentifiable sputum pt. Coughed up, Dr. Jimmye Norman states she will look at it in rounds tomorrow.  ?

## 2021-11-17 NOTE — Progress Notes (Signed)
*  PRELIMINARY RESULTS* ?Echocardiogram ?2D Echocardiogram has been performed. ? ?Shannon Hendrix ?11/17/2021, 11:09 AM ?

## 2021-11-17 NOTE — ED Notes (Signed)
Dr. Jimmye Norman updated on pt's condition, asked if she wanted pt to remain on amio alone, or if she wanted this RN to restart cardizem. Dr. Jimmye Norman states don't restart cardizem. Will continue to monitor. ?

## 2021-11-17 NOTE — ED Notes (Signed)
Pt's cardizem infusion bag ran out, HR has maintained between 98-105, This RN contacted Dr. Jimmye Norman to ask if they wanted to continue cardizem drip or d/c. Notified MD that pt's BP has been soft. ?

## 2021-11-17 NOTE — ED Notes (Signed)
Pt. Up to toilet in room, minimal assistance. Gait steady, NAD. ?

## 2021-11-17 NOTE — ED Notes (Signed)
Pt. Having short runs, 2-3 beats of Vtach, asymptomatic, denies CP, SOB, dizziness etc. Dr. Sidney Ace notified, and updated on pt. Condition by this RN. Cardiology will follow up. ?

## 2021-11-17 NOTE — ED Notes (Addendum)
Pt called out and when tech entered room she wanted to show what she coughed up. Inside the napkin was yellow and clear mucus as well as a pink and white tissue type object. Object is tough in texture and unable to pull apart with tongue suppressor. Pt says she has some relief since coughing it up. PT states that, 'feeling like something was in her throat' was the whole reason she checked in, but her heart issues have taken priority over this issue. This object does not appear to look like her lunch of chicken, carrots, potatoes and pudding.  ?Collected for RN to look at in specimen cup.  ?

## 2021-11-17 NOTE — Assessment & Plan Note (Signed)
-   We will continue statin therapy. 

## 2021-11-17 NOTE — Assessment & Plan Note (Signed)
-  We will replace potassium and check magnesium level. 

## 2021-11-17 NOTE — Assessment & Plan Note (Addendum)
-   The patient will be admitted to a PCU bed, ?- We will continue him on IV amiodarone and withdraw IV Cardizem gradually. ?- We will follow serial troponins. ?- Cardiology consult and 2D echo will be obtained. ?- I notified Dr. Clayborn Bigness about the patient. ?- We will optimize his hypokalemia ?-Her CHA2DS2-VASc score is 4. ?- She was started on p.o. Eliquis. ?

## 2021-11-17 NOTE — ED Notes (Signed)
Informed RN bed assigned 

## 2021-11-17 NOTE — Consult Note (Signed)
?CARDIOLOGY CONSULT NOTE  ? ?   ?    ?  ? ? ? ?Patient ID: ?Shannon Hendrix ?MRN: [id number] ?DOB/AGE: 08/14/1933 88 y.o. ? ?Admit date: 11/16/2021 ?Referring Physician Dr. Eugenie Norrie hospitalist ?Primary Physician Dr. Kerrin Mo III primary ?Primary Cardiologist  ?Reason for Consultation atrial fibrillation new onset RVR ? ?HPI: Patient is a 88 year old female history of reflux hyperlipidemia esophageal stricture with DJD reportedly had an episode of heart racing not feeling well mild hypotension she was found to be hypokalemic patient also had feelings of tachycardia choking feeling in her throat with discomfort the patient was given Cardizem IV which seem to cause more hypotension than heart rate control she was switched to IV amiodarone and subsequently appeared to convert to sinus rhythm while in the emergency room.  Patient denies any previous cardiac history has not seen cardiology before feeling of not being able to breathe and choking sensation and pressure in the base of her throat as well as tachycardia prompted her to come to the ER and she was found to be in rapid atrial fibrillation.  Patient had a significant CHA2DS2-VASc score of 4.  Anticoagulation is being recommended with no significant contraindication ? ?Review of systems complete and found to be negative unless listed above  ? ? ? ?Past Medical History:  ?Diagnosis Date  ? Arthritis   ? Cardiac arrhythmia   ? Dysrhythmia   ? Esophageal stricture   ? GERD (gastroesophageal reflux disease)   ? Hiatal hernia   ? HLD (hyperlipidemia)   ?  ?Past Surgical History:  ?Procedure Laterality Date  ? ABDOMINAL HYSTERECTOMY    ? BREAST BIOPSY Left 04/18/14  ? BENIGN BREAST TISSUE WITH NODULAR FIBROSIS.   ? EYE SURGERY Bilateral   ? TOTAL HIP ARTHROPLASTY Right 09/24/2017  ? Procedure: TOTAL HIP ARTHROPLASTY;  Surgeon: Dereck Leep, MD;  Location: [facility] ORS;  Service: Orthopedics;  Laterality: Right;  ?  ?(Not in a hospital admission) ? ?Social History   ? ?Socioeconomic History  ? Marital status: Married  ?  Spouse name: Not on file  ? Number of children: 1  ? Years of education: Not on file  ? Highest education level: Not on file  ?Occupational History  ? Occupation: retired  ?  Employer: [employer]  ?Tobacco Use  ? Smoking status: Former  ?  Packs/day: 0.30  ?  Years: 5.00  ?  Pack years: 1.50  ?  Types: Cigarettes  ?  Quit date: 07/29/1961  ?  Years since quitting: 60.3  ? Smokeless tobacco: Never  ?Vaping Use  ? Vaping Use: Never used  ?Substance and Sexual Activity  ? Alcohol use: No  ? Drug use: No  ? Sexual activity: Not on file  ?Other Topics Concern  ? Not on file  ?Social History Narrative  ? Not on file  ? ?Social Determinants of Health  ? ?Financial Resource Strain: Not on file  ?Food Insecurity: Not on file  ?Transportation Needs: Not on file  ?Physical Activity: Not on file  ?Stress: Not on file  ?Social Connections: Not on file  ?Intimate Partner Violence: Not on file  ?  ?Family History  ?Problem Relation Age of Onset  ? Bone cancer Sister   ? Heart attack Brother   ? Breast cancer Neg Hx   ?  ? ? ?Review of systems complete and found to be negative unless listed above  ? ? ? ? ?PHYSICAL EXAM ? ?General: Well developed, well  nourished, in no acute distress ?HEENT:  Normocephalic and atramatic ?Neck:  No JVD.  ?Lungs: Clear bilaterally to auscultation and percussion. ?Heart: HRRR . Normal S1 and S2 without gallops or murmurs.  ?Abdomen: Bowel sounds are positive, abdomen soft and non-tender  ?Msk:  Back normal, normal gait. Normal strength and tone for age. ?Extremities: No clubbing, cyanosis or edema.   ?Neuro: Alert and oriented X 3. ?Psych:  Good affect, responds appropriately ? ?Labs: ?  ?Lab Results  ?Component Value Date  ? WBC 16.6 (H) 11/17/2021  ? HGB 13.5 11/17/2021  ? HCT 41.2 11/17/2021  ? MCV 89.0 11/17/2021  ? PLT 189 11/17/2021  ?  ?Recent Labs  ?Lab 11/17/21 ?2671  ?NA 135  ?K 3.4*  ?CL 104  ?CO2 24  ?BUN 8  ?CREATININE 0.73   ?CALCIUM 8.5*  ?GLUCOSE 173*  ? ?No results found for: CKTOTAL, CKMB, CKMBINDEX, TROPONINI No results found for: CHOL ?No results found for: HDL ?No results found for: [place] ?No results found for: TRIG ?No results found for: CHOLHDL ?No results found for: LDLDIRECT  ?  ?Radiology: DG Chest 1 View ? ?Result Date: 11/16/2021 ?CLINICAL DATA:  shob EXAM: CHEST  1 VIEW COMPARISON:  None. FINDINGS: The heart and mediastinal contours are within normal limits. Aortic calcification. No focal consolidation. Coarsened markings with no overt pulmonary edema. No pleural effusion. No pneumothorax. No acute osseous abnormality.  Old healed right rib fractures. IMPRESSION: 1. No active disease. 2.  Aortic Atherosclerosis (ICD10-I70.0). Electronically Signed   By: Iven Finn M.D.   On: 11/16/2021 23:58  ? ?ECHOCARDIOGRAM COMPLETE ? ?Result Date: 11/17/2021 ?   ECHOCARDIOGRAM REPORT   Patient Name:   Shannon Hendrix Date of Exam: 11/17/2021 Medical Rec #:  [id number]        Height:       65.0 in Accession #:    [id number]       Weight:       165.0 lb Date of Birth:  25-Oct-1933       BSA:          1.823 m? Patient Age:    52 years         BP:           106/84 mmHg Patient Gender: F                HR:           106 bpm. Exam Location:  [facility] Procedure: 2D Echo Indications:     Atrial Fibrillation I48.91  History:         Patient has no prior history of Echocardiogram examinations.  Sonographer:     Kathlen Brunswick RDCS Referring Phys:  [id number] Mary Sella A MANSY Diagnosing Phys: Yolonda Kida MD IMPRESSIONS  1. Left ventricular ejection fraction, by estimation, is 55 to 60%. The left ventricle has normal function. The left ventricle has no regional wall motion abnormalities. Left ventricular diastolic parameters are consistent with Grade I diastolic dysfunction (impaired relaxation).  2. Right ventricular systolic function is normal. The right ventricular size is normal.  3. The mitral valve is normal in structure. Trivial mitral  valve regurgitation.  4. The aortic valve is normal in structure. Aortic valve regurgitation is not visualized. FINDINGS  Left Ventricle: Left ventricular ejection fraction, by estimation, is 55 to 60%. The left ventricle has normal function. The left ventricle has no regional wall motion abnormalities. The left ventricular internal cavity size was normal in size. There  is  no left ventricular hypertrophy. Left ventricular diastolic parameters are consistent with Grade I diastolic dysfunction (impaired relaxation). Right Ventricle: The right ventricular size is normal. No increase in right ventricular wall thickness. Right ventricular systolic function is normal. Left Atrium: Left atrial size was normal in size. Right Atrium: Right atrial size was normal in size. Pericardium: There is no evidence of pericardial effusion. Mitral Valve: The mitral valve is normal in structure. Trivial mitral valve regurgitation. Tricuspid Valve: The tricuspid valve is normal in structure. Tricuspid valve regurgitation is mild. Aortic Valve: The aortic valve is normal in structure. Aortic valve regurgitation is not visualized. Aortic valve peak gradient measures 8.8 mmHg. Pulmonic Valve: The pulmonic valve was normal in structure. Pulmonic valve regurgitation is not visualized. Aorta: The ascending aorta was not well visualized. IAS/Shunts: No atrial level shunt detected by color flow Doppler.  LEFT VENTRICLE PLAX 2D LVIDd:         4.75 cm     Diastology LVIDs:         3.37 cm     LV e' medial:    4.90 cm/s LV PW:         1.09 cm     LV E/e' medial:  18.4 LV IVS:        0.93 cm     LV e' lateral:   7.07 cm/s LVOT diam:     1.80 cm     LV E/e' lateral: 12.8 LV SV:         48 LV SV Index:   27 LVOT Area:     2.54 cm?  LV Volumes (MOD) LV vol d, MOD A4C: 86.0 ml LV vol s, MOD A4C: 34.2 ml LV SV MOD A4C:     86.0 ml RIGHT VENTRICLE RV Basal diam:  2.76 cm RV S prime:     17.40 cm/s TAPSE (M-mode): 1.8 cm LEFT ATRIUM             Index         RIGHT ATRIUM          Index LA diam:        3.80 cm 2.08 cm/m?   RA Area:     9.88 cm? LA Vol (A2C):   43.8 ml 24.03 ml/m?  RA Volume:   20.80 ml 11.41 ml/m? LA Vol (A4C):   42.0 ml 23.04 ml/m? LA Biplane Vol:

## 2021-11-18 DIAGNOSIS — E785 Hyperlipidemia, unspecified: Secondary | ICD-10-CM | POA: Diagnosis not present

## 2021-11-18 DIAGNOSIS — I4891 Unspecified atrial fibrillation: Secondary | ICD-10-CM | POA: Diagnosis not present

## 2021-11-18 DIAGNOSIS — F32A Depression, unspecified: Secondary | ICD-10-CM | POA: Diagnosis not present

## 2021-11-18 LAB — CBC
HCT: 40.7 % (ref 36.0–46.0)
Hemoglobin: 13.7 g/dL (ref 12.0–15.0)
MCH: 29.9 pg (ref 26.0–34.0)
MCHC: 33.7 g/dL (ref 30.0–36.0)
MCV: 88.9 fL (ref 80.0–100.0)
Platelets: 201 10*3/uL (ref 150–400)
RBC: 4.58 MIL/uL (ref 3.87–5.11)
RDW: 12.8 % (ref 11.5–15.5)
WBC: 11.4 10*3/uL — ABNORMAL HIGH (ref 4.0–10.5)
nRBC: 0 % (ref 0.0–0.2)

## 2021-11-18 LAB — BASIC METABOLIC PANEL
Anion gap: 7 (ref 5–15)
BUN: 5 mg/dL — ABNORMAL LOW (ref 8–23)
CO2: 24 mmol/L (ref 22–32)
Calcium: 8.3 mg/dL — ABNORMAL LOW (ref 8.9–10.3)
Chloride: 104 mmol/L (ref 98–111)
Creatinine, Ser: 0.77 mg/dL (ref 0.44–1.00)
GFR, Estimated: >60 mL/min (ref >60)
Glucose, Bld: 112 mg/dL — ABNORMAL HIGH (ref 70–99)
Potassium: 3.5 mmol/L (ref 3.5–5.1)
Sodium: 135 mmol/L (ref 135–145)

## 2021-11-18 MED ORDER — METOPROLOL SUCCINATE ER 50 MG PO TB24
50.0000 mg | ORAL_TABLET | Freq: Every day | ORAL | Status: DC
  Administered 2021-11-18: 50 mg via ORAL
  Filled 2021-11-18: qty 1

## 2021-11-18 MED ORDER — APIXABAN 5 MG PO TABS: 5.0000 mg | ORAL_TABLET | Freq: Two times a day (BID) | ORAL | 0 refills | Status: AC

## 2021-11-18 MED ORDER — AMIODARONE HCL 200 MG PO TABS: 200.0000 mg | ORAL_TABLET | Freq: Every day | ORAL | Status: DC

## 2021-11-18 MED ORDER — AMIODARONE HCL 200 MG PO TABS: 200.0000 mg | ORAL_TABLET | Freq: Every day | ORAL | 0 refills | Status: AC

## 2021-11-18 MED ORDER — METOPROLOL SUCCINATE ER 50 MG PO TB24: 50.0000 mg | ORAL_TABLET | Freq: Every day | ORAL | 0 refills | Status: AC

## 2021-11-18 NOTE — Progress Notes (Signed)
[facility] ? ? ? ?SUBJECTIVE: Patient states to be doing reasonably well denies any further palpitation no throat discomfort states that she was able to cough up some phlegm which seems to improve her shortness of breath and throat discomfort no fever chills or sweats feels well enough to go home ? ? ?Vitals:  ? 11/18/21 0330 11/18/21 0730 11/18/21 0841 11/18/21 0841  ?BP: 108/76 112/76  105/81  ?Pulse: 94   (!) 101  ?Resp: 16 (!) 23  18  ?Temp: 98.8 ?F (37.1 ?C) 98.2 ?F (36.8 ?C) 98.4 ?F (36.9 ?C)   ?TempSrc: Oral Oral Oral   ?SpO2: 96%   90%  ?Weight:      ?Height:      ? ? ? ?Intake/Output Summary (Last 24 hours) at 11/18/2021 1026 ?Last data filed at 11/18/2021 1011 ?Gross per 24 hour  ?Intake 1395.17 ml  ?Output --  ?Net 1395.17 ml  ? ? ? ? ?PHYSICAL EXAM ? ?General: Well developed, well nourished, in no acute distress ?HEENT:  Normocephalic and atramatic ?Neck:  No JVD.  ?Lungs: Clear bilaterally to auscultation and percussion. ?Heart: HRRR . Normal S1 and S2 without gallops or murmurs.  ?Abdomen: Bowel sounds are positive, abdomen soft and non-tender  ?Msk:  Back normal, normal gait. Normal strength and tone for age. ?Extremities: No clubbing, cyanosis or edema.   ?Neuro: Alert and oriented X 3. ?Psych:  Good affect, responds appropriately ? ? ?LABS: ?Basic Metabolic Panel: ?Recent Labs  ?  11/17/21 ?0427 11/18/21 ?0806  ?NA 135 135  ?K 3.4* 3.5  ?CL 104 104  ?CO2 24 24  ?GLUCOSE 173* 112*  ?BUN 8 5*  ?CREATININE 0.73 0.77  ?CALCIUM 8.5* 8.3*  ?MG 2.1  --   ? ?Liver Function Tests: ?No results for input(s): AST, ALT, ALKPHOS, BILITOT, PROT, ALBUMIN in the last 72 hours. ?No results for input(s): LIPASE, AMYLASE in the last 72 hours. ?CBC: ?Recent Labs  ?  11/17/21 ?0427 11/18/21 ?0806  ?WBC 16.6* 11.4*  ?HGB 13.5 13.7  ?HCT 41.2 40.7  ?MCV 89.0 88.9  ?PLT 189 201  ? ?Cardiac Enzymes: ?No results for input(s): CKTOTAL, CKMB, CKMBINDEX, TROPONINI in the last 72 hours. ?BNP: ?Invalid input(s):  POCBNP ?D-Dimer: ?No results for input(s): DDIMER in the last 72 hours. ?Hemoglobin A1C: ?No results for input(s): HGBA1C in the last 72 hours. ?Fasting Lipid Panel: ?No results for input(s): CHOL, HDL, LDLCALC, TRIG, CHOLHDL, LDLDIRECT in the last 72 hours. ?Thyroid Function Tests: ?Recent Labs  ?  11/17/21 ?0236  ?TSH 1.805  ? ?Anemia Panel: ?No results for input(s): VITAMINB12, FOLATE, FERRITIN, TIBC, IRON, RETICCTPCT in the last 72 hours. ? ?DG Chest 1 View ? ?Result Date: 11/16/2021 ?CLINICAL DATA:  shob EXAM: CHEST  1 VIEW COMPARISON:  None. FINDINGS: The heart and mediastinal contours are within normal limits. Aortic calcification. No focal consolidation. Coarsened markings with no overt pulmonary edema. No pleural effusion. No pneumothorax. No acute osseous abnormality.  Old healed right rib fractures. IMPRESSION: 1. No active disease. 2.  Aortic Atherosclerosis (ICD10-I70.0). Electronically Signed   By: Iven Finn M.D.   On: 11/16/2021 23:58  ? ?ECHOCARDIOGRAM COMPLETE ? ?Result Date: 11/17/2021 ?   ECHOCARDIOGRAM REPORT   Patient Name:   Shannon Hendrix Date of Exam: 11/17/2021 Medical Rec #:  [id number]        Height:       65.0 in Accession #:    [id number]       Weight:  165.0 lb Date of Birth:  [birth year]       BSA:          1.823 m? Patient Age:    88 years         BP:           106/84 mmHg Patient Gender: F                HR:           106 bpm. Exam Location:  [facility] Procedure: 2D Echo Indications:     Atrial Fibrillation I48.91  History:         Patient has no prior history of Echocardiogram examinations.  Sonographer:     Kathlen Brunswick RDCS Referring Phys:  [id number] Mary Sella A MANSY Diagnosing Phys: Yolonda Kida MD IMPRESSIONS  1. Left ventricular ejection fraction, by estimation, is 55 to 60%. The left ventricle has normal function. The left ventricle has no regional wall motion abnormalities. Left ventricular diastolic parameters are consistent with Grade I diastolic dysfunction (impaired  relaxation).  2. Right ventricular systolic function is normal. The right ventricular size is normal.  3. The mitral valve is normal in structure. Trivial mitral valve regurgitation.  4. The aortic valve is normal in structure. Aortic valve regurgitation is not visualized. FINDINGS  Left Ventricle: Left ventricular ejection fraction, by estimation, is 55 to 60%. The left ventricle has normal function. The left ventricle has no regional wall motion abnormalities. The left ventricular internal cavity size was normal in size. There is  no left ventricular hypertrophy. Left ventricular diastolic parameters are consistent with Grade I diastolic dysfunction (impaired relaxation). Right Ventricle: The right ventricular size is normal. No increase in right ventricular wall thickness. Right ventricular systolic function is normal. Left Atrium: Left atrial size was normal in size. Right Atrium: Right atrial size was normal in size. Pericardium: There is no evidence of pericardial effusion. Mitral Valve: The mitral valve is normal in structure. Trivial mitral valve regurgitation. Tricuspid Valve: The tricuspid valve is normal in structure. Tricuspid valve regurgitation is mild. Aortic Valve: The aortic valve is normal in structure. Aortic valve regurgitation is not visualized. Aortic valve peak gradient measures 8.8 mmHg. Pulmonic Valve: The pulmonic valve was normal in structure. Pulmonic valve regurgitation is not visualized. Aorta: The ascending aorta was not well visualized. IAS/Shunts: No atrial level shunt detected by color flow Doppler.  LEFT VENTRICLE PLAX 2D LVIDd:         4.75 cm     Diastology LVIDs:         3.37 cm     LV e' medial:    4.90 cm/s LV PW:         1.09 cm     LV E/e' medial:  18.4 LV IVS:        0.93 cm     LV e' lateral:   7.07 cm/s LVOT diam:     1.80 cm     LV E/e' lateral: 12.8 LV SV:         48 LV SV Index:   27 LVOT Area:     2.54 cm?  LV Volumes (MOD) LV vol d, MOD A4C: 86.0 ml LV vol s, MOD  A4C: 34.2 ml LV SV MOD A4C:     86.0 ml RIGHT VENTRICLE RV Basal diam:  2.76 cm RV S prime:     17.40 cm/s TAPSE (M-mode): 1.8 cm LEFT ATRIUM  Index        RIGHT ATRIUM          Index LA diam:        3.80 cm 2.08 cm/m?   RA Area:     9.88 cm? LA Vol (A2C):   43.8 ml 24.03 ml/m?  RA Volume:   20.80 ml 11.41 ml/m? LA Vol (A4C):   42.0 ml 23.04 ml/m? LA Biplane Vol: 43.0 ml 23.59 ml/m?  AORTIC VALVE                 PULMONIC VALVE AV Area (Vmax): 1.74 cm?     PV Vmax:       1.22 m/s AV Vmax:        148.00 cm/s  PV Peak grad:  6.0 mmHg AV Peak Grad:   8.8 mmHg LVOT Vmax:      101.00 cm/s LVOT Vmean:     69.000 cm/s LVOT VTI:       0.190 m  AORTA Ao Root diam: 2.80 cm Ao Asc diam:  2.40 cm MITRAL VALVE                TRICUSPID VALVE MV Area (PHT): 5.75 cm?     TV Peak grad:   29.1 mmHg MV Decel Time: 132 msec     TV Vmax:        2.70 m/s MV E velocity: 90.40 cm/s MV A velocity: 104.00 cm/s  SHUNTS MV E/A ratio:  0.87         Systemic VTI:  0.19 m                             Systemic Diam: 1.80 cm Yolonda Kida MD Electronically signed by Yolonda Kida MD Signature Date/Time: 11/17/2021/12:47:44 PM    Final    ? ? ?Echo normal left ventricular function 55 to 60% no valvular abnormalities ? ?TELEMETRY: Normal sinus rhythm rate of 85: ? ?ASSESSMENT AND PLAN: ? ?Principal Problem: ?  Atrial fibrillation with rapid ventricular response ([place]) ?Active Problems: ?  Hypokalemia ?  Dyslipidemia ?  Depression ?GERD ?Hyperlipidemia ? ?Plan ?Recommend Eliquis therapy 5 mg twice a day for anticoagulation for A-fib ?Low-dose beta-blockade for rate control metoprolol succinate 50 mg daily ?Hyperlipidemia continue Crestor therapy for lipid management ?Agree with Pravachol therapy for GERD ?Recommend amiodarone low-dose 200 mg daily to help with antiarrhythmic management and control ?Correct electrolytes including hypokalemia ?Recommend outpatient functional study to rule out ischemia ?Have the patient follow-up 1 to 2  weeks ? ? ? ?Yolonda Kida, MD ?11/18/2021 ?10:26 AM ? ? ? ?  ?

## 2021-11-18 NOTE — Evaluation (Signed)
Physical Therapy Evaluation ?Patient Details ?Name: Shannon Hendrix ?MRN: [id number] ?DOB: 03/27/1934 ?Today's Date: 11/18/2021 ? ?History of Present Illness ? Pt is an 88 y/o F admitted on 11/16/21 after presenting with c/c of SOB. Pt is being treated for a-fib with RVR. PMH: GERD, dyslipidemia, esophageal stricture, OA, HLD, hiatal hernia  ?Clinical Impression ? Pt seen for PT evaluation with pt received in room with daughter Shirlean Mylar) present. Prior to admission pt was living alone in a 1 level home with level entry, independent with all mobility without AD & driving. On this date, pt is able to complete STS without assistance & ambulate 3 laps around nurses station without AD. Pt initially with slight LOB but able to correct with CGA and does progress to mod I with gait. Educated pt on recommendation to ambulate around unit with daughter while in acute setting, as well as PRN assistance upon initial return home, otherwise pt does not require acute PT services. PT to sign off at this time.   ?   ? ?Recommendations for follow up therapy are one component of a multi-disciplinary discharge planning process, led by the attending physician.  Recommendations may be updated based on patient status, additional functional criteria and insurance authorization. ? ?Follow Up Recommendations No PT follow up ? ?  ?Assistance Recommended at Discharge PRN  ?Patient can return home with the following ?   ? ?  ?Equipment Recommendations None recommended by PT  ?Recommendations for Other Services ?    ?  ?Functional Status Assessment Patient has not had a recent decline in their functional status  ? ?  ?Precautions / Restrictions Precautions ?Precautions: None ?Restrictions ?Weight Bearing Restrictions: No  ? ?  ? ?Mobility ? Bed Mobility ?  ?  ?  ?  ?  ?  ?  ?General bed mobility comments: not observed, pt received & left sitting in recliner ?  ? ?Transfers ?Overall transfer level: Modified independent ?Equipment used: None ?  ?  ?  ?   ?  ?  ?  ?General transfer comment: STS without AD ?  ? ?Ambulation/Gait ?Ambulation/Gait assistance: Modified independent (Device/Increase time) ?Gait Distance (Feet): 550 Feet ?Assistive device: None ?Gait Pattern/deviations: Step-through pattern ?Gait velocity: WNL ?  ?  ?General Gait Details: Pt initially with slight LOB & CGA to correct but able to progress to mod I without AD, ambulating 3 laps around nurses station. ? ?Stairs ?  ?  ?  ?  ?  ? ?Wheelchair Mobility ?  ? ?Modified Rankin (Stroke Patients Only) ?  ? ?  ? ?Balance Overall balance assessment: Needs assistance ?Sitting-balance support: Feet supported ?Sitting balance-Leahy Scale: Normal ?  ?  ?Standing balance support: During functional activity, No upper extremity supported ?Standing balance-Leahy Scale: Good ?  ?  ?  ?  ?  ?  ?  ?  ?  ?  ?  ?  ?   ? ? ? ?Pertinent Vitals/Pain Pain Assessment ?Pain Assessment: No/denies pain  ? ? ?Home Living Family/patient expects to be discharged to:: Private residence ?Living Arrangements: Alone ?Available Help at Discharge: Family;Available PRN/intermittently ?Type of Home: House ?Home Access: Level entry ?  ?  ?  ?Home Layout: One level ?  ?   ?  ?Prior Function Prior Level of Function : Independent/Modified Independent;Driving ?  ?  ?  ?  ?  ?  ?Mobility Comments: Independent with gait without AD, reports 1 fall in past 4-5 months 2/2 foot/shoe slipping on curb ?ADLs  Comments: Cooks, cleans without assistance ?  ? ? ?Hand Dominance  ?   ? ?  ?Extremity/Trunk Assessment  ? Upper Extremity Assessment ?Upper Extremity Assessment: Overall WFL for tasks assessed ?  ? ?Lower Extremity Assessment ?Lower Extremity Assessment: Overall WFL for tasks assessed ?  ? ?   ?Communication  ? Communication: No difficulties  ?Cognition Arousal/Alertness: Awake/alert ?Behavior During Therapy: [facility] for tasks assessed/performed ?Overall Cognitive Status: Within Functional Limits for tasks assessed ?  ?  ?  ?  ?  ?  ?  ?  ?  ?  ?   ?  ?  ?  ?  ?  ?General Comments: Pleasant lady ?  ?  ? ?  ?General Comments General comments (skin integrity, edema, etc.): max HR 112 bpm ? ?  ?Exercises    ? ?Assessment/Plan  ?  ?PT Assessment Patient does not need any further PT services  ?PT Problem List   ? ?   ?  ?PT Treatment Interventions     ? ?PT Goals (Current goals can be found in the Care Plan section)  ?Acute Rehab PT Goals ?Patient Stated Goal: get better ?PT Goal Formulation: With patient ?Time For Goal Achievement: 12/02/21 ?Potential to Achieve Goals: Good ? ?  ?Frequency   ?  ? ? ?Co-evaluation   ?  ?  ?  ?  ? ? ?  ?AM-PAC PT "6 Clicks" Mobility  ?Outcome Measure Help needed turning from your back to your side while in a flat bed without using bedrails?: None ?Help needed moving from lying on your back to sitting on the side of a flat bed without using bedrails?: None ?Help needed moving to and from a bed to a chair (including a wheelchair)?: None ?Help needed standing up from a chair using your arms (e.g., wheelchair or bedside chair)?: None ?Help needed to walk in hospital room?: None ?Help needed climbing 3-5 steps with a railing? : A Little ?6 Click Score: 23 ? ?  ?End of Session   ?Activity Tolerance: Patient tolerated treatment well ?Patient left: in chair;with call bell/phone within reach;with family/visitor present ?  ?  ?  ? ?Time: 9833-8250 ?PT Time Calculation (min) (ACUTE ONLY): 12 min ? ? ?Charges:   PT Evaluation ?$PT Eval Low Complexity: 1 Low ?  ?  ?   ? ? ?Lavone Nian, PT, DPT ?11/18/21, 9:22 AM ? ? ?Waunita Schooner ?11/18/2021, 9:20 AM ? ?

## 2021-11-18 NOTE — Discharge Summary (Signed)
Physician Discharge Summary  ?Shannon Hendrix OVF:[id number] DOB: 06/04/1934 DOA: 11/16/2021 ? ?PCP: Adin Hector, MD ? ?Admit date: 11/16/2021 ?Discharge date: 11/18/2021 ? ?Admitted From: home  ?Disposition:  home  ? ?Recommendations for Outpatient Follow-up:  ?Follow up with PCP in 1-2 weeks ?F/u w/ cardio in 1-2 weeks  ? ?Home Health: no  ?Equipment/Devices: ? ?Discharge Condition: stable  ?CODE STATUS: DNR  ?Diet recommendation: Heart Healthy ? ?Brief/Interim Summary: ?HPI was taken from Dr. Sidney Ace: ?Shannon Hendrix is a 88 y.o. Caucasian female with medical history significant for GERD, dyslipidemia, esophageal stricture, osteoarthritis, who presented to the emergency room with acute onset of a feeling of throat closing up when she is laying flat and occasional palpitations.  She denied any chest pain or dyspnea or cough or wheezing.  No nausea or vomiting or abdominal pain.  No fever or chills.  No dysuria, oliguria or hematuria or flank pain. ?  ?ED Course: When she came to the ER, heart rate was 168 with a BP of 80/58 and later 125/89 with otherwise normal vital signs.  Labs revealed hypokalemia of 3.2 and high-sensitivity troponin of 21 with CBC showing leukocytosis of 14.2.  TSH was 1.805 and free T41.22 ?EKG as reviewed by me : Atrial fibrillation with rapid ventricular sponsor of 164 with PVCs. ?Imaging: Chest x-ray showed aortic atherosclerosis with no acute disease. ? ?The patient was given 10 mg of IV Cardizem bolus followed by drip 1 g of IV calcium gluconate, 500 mill IV normal saline bolus and 40 mill [place] p.o. potassium chloride.  She was still tachycardic despite IV Cardizem drip at 15 mg/h.  I started her on IV amiodarone with bolus and drip.  She will be admitted to a progressive unit bed for further evaluation and management ? ? ?As per Dr. Jimmye Norman 4/22-4/23/23: Pt presented w/ new onset a. fib w/ RVR and started on IV amio & IV cardizem drips. Both the drips were weaned off and pt was  started on amio, metoprolol succinate and eliquis as per cardio. Pt will f/u w/ cardio in 1-2 weeks. ? ?Discharge Diagnoses:  ?Principal Problem: ?  Atrial fibrillation with rapid ventricular response ([place]) ?Active Problems: ?  Hypokalemia ?  Dyslipidemia ?  Depression ? ?A. fib: w/ RVR. New onset. Continue on IV amio drip and d/c IV cardizem drip. CHA2DS2-VASC score 4. Continue on amiodarone, metoprolol, & eliquis. Continue on tele. Cardio recs apprec  ? ?Hypokalemia: WNL today  ? ?Leukocytosis: likely reactive, trending down  ? ?HLD: continue on statin ? ?Depression: severity unknown. Continue on home dose sertraline  ? ?Discharge Instructions ? ?Discharge Instructions   ? ? Amb referral to [facility]   Complete by: As directed ?  ? Diet - low sodium heart healthy   Complete by: As directed ?  ? Discharge instructions   Complete by: As directed ?  ? F/u w/ cardio in 1-2 weeks. F/u w/ PCP in 1-2 weeks  ? Increase activity slowly   Complete by: As directed ?  ? ?  ? ?Allergies as of 11/18/2021   ? ?   Reactions  ? Nitrofurantoin Rash  ? ?  ? ?  ?Medication List  ?  ? ?STOP taking these medications   ? ?lovastatin 20 MG tablet ?Commonly known as: MEVACOR ?  ?metoprolol tartrate 25 MG tablet ?Commonly known as: LOPRESSOR ?  ? ?  ? ?TAKE these medications   ? ?acetaminophen 500 MG tablet ?Commonly known as: TYLENOL ?  Take 1,000 mg by mouth every 6 (six) hours as needed (for pain.). ?  ?amiodarone 200 MG tablet ?Commonly known as: PACERONE ?Take 1 tablet (200 mg total) by mouth daily. ?Start taking on: November 19, 2021 ?  ?apixaban 5 MG Tabs tablet ?Commonly known as: ELIQUIS ?Take 1 tablet (5 mg total) by mouth 2 (two) times daily. ?  ?Fish Oil 1000 MG Caps ?Take 2 capsules by mouth 2 (two) times daily. ?  ?metoprolol succinate 50 MG 24 hr tablet ?Commonly known as: TOPROL-XL ?Take 1 tablet (50 mg total) by mouth daily. Take with or immediately following a meal. ?  ?multivitamin with minerals Tabs tablet ?Take 1 tablet by  mouth daily. ?  ?nortriptyline 25 MG capsule ?Commonly known as: PAMELOR ?Take 25 mg by mouth at bedtime. ?  ?omeprazole 20 MG capsule ?Commonly known as: PRILOSEC ?Take 20 mg by mouth daily. ?  ?rosuvastatin 10 MG tablet ?Commonly known as: CRESTOR ?Take 10 mg by mouth daily. ?  ?sertraline 25 MG tablet ?Commonly known as: ZOLOFT ?Take 25 mg by mouth daily. ?  ?vitamin B-12 1000 MCG tablet ?Commonly known as: CYANOCOBALAMIN ?Take 1,000 mcg by mouth daily. ?  ?Vitamin D-3 125 MCG (5000 UT) Tabs ?Take 5,000 Units by mouth daily. ?  ? ?  ? ? ?Allergies  ?Allergen Reactions  ? Nitrofurantoin Rash  ? ? ?Consultations: ?Cardio  ? ? ?Procedures/Studies: ?DG Chest 1 View ? ?Result Date: 11/16/2021 ?CLINICAL DATA:  shob EXAM: CHEST  1 VIEW COMPARISON:  None. FINDINGS: The heart and mediastinal contours are within normal limits. Aortic calcification. No focal consolidation. Coarsened markings with no overt pulmonary edema. No pleural effusion. No pneumothorax. No acute osseous abnormality.  Old healed right rib fractures. IMPRESSION: 1. No active disease. 2.  Aortic Atherosclerosis (ICD10-I70.0). Electronically Signed   By: Iven Finn M.D.   On: 11/16/2021 23:58  ? ?ECHOCARDIOGRAM COMPLETE ? ?Result Date: 11/17/2021 ?   ECHOCARDIOGRAM REPORT   Patient Name:   Shannon Hendrix Date of Exam: 11/17/2021 Medical Rec #:  [id number]        Height:       65.0 in Accession #:    [id number]       Weight:       165.0 lb Date of Birth:  07/11/1934       BSA:          1.823 m? Patient Age:    88 years         BP:           106/84 mmHg Patient Gender: F                HR:           106 bpm. Exam Location:  [facility] Procedure: 2D Echo Indications:     Atrial Fibrillation I48.91  History:         Patient has no prior history of Echocardiogram examinations.  Sonographer:     Kathlen Brunswick RDCS Referring Phys:  [id number] Mary Sella A MANSY Diagnosing Phys: Yolonda Kida MD IMPRESSIONS  1. Left ventricular ejection fraction, by estimation, is 55  to 60%. The left ventricle has normal function. The left ventricle has no regional wall motion abnormalities. Left ventricular diastolic parameters are consistent with Grade I diastolic dysfunction (impaired relaxation).  2. Right ventricular systolic function is normal. The right ventricular size is normal.  3. The mitral valve is normal in structure. Trivial mitral valve regurgitation.  4. The aortic  valve is normal in structure. Aortic valve regurgitation is not visualized. FINDINGS  Left Ventricle: Left ventricular ejection fraction, by estimation, is 55 to 60%. The left ventricle has normal function. The left ventricle has no regional wall motion abnormalities. The left ventricular internal cavity size was normal in size. There is  no left ventricular hypertrophy. Left ventricular diastolic parameters are consistent with Grade I diastolic dysfunction (impaired relaxation). Right Ventricle: The right ventricular size is normal. No increase in right ventricular wall thickness. Right ventricular systolic function is normal. Left Atrium: Left atrial size was normal in size. Right Atrium: Right atrial size was normal in size. Pericardium: There is no evidence of pericardial effusion. Mitral Valve: The mitral valve is normal in structure. Trivial mitral valve regurgitation. Tricuspid Valve: The tricuspid valve is normal in structure. Tricuspid valve regurgitation is mild. Aortic Valve: The aortic valve is normal in structure. Aortic valve regurgitation is not visualized. Aortic valve peak gradient measures 8.8 mmHg. Pulmonic Valve: The pulmonic valve was normal in structure. Pulmonic valve regurgitation is not visualized. Aorta: The ascending aorta was not well visualized. IAS/Shunts: No atrial level shunt detected by color flow Doppler.  LEFT VENTRICLE PLAX 2D LVIDd:         4.75 cm     Diastology LVIDs:         3.37 cm     LV e' medial:    4.90 cm/s LV PW:         1.09 cm     LV E/e' medial:  18.4 LV IVS:         0.93 cm     LV e' lateral:   7.07 cm/s LVOT diam:     1.80 cm     LV E/e' lateral: 12.8 LV SV:         48 LV SV Index:   27 LVOT Area:     2.54 cm?  LV Volumes (MOD) LV vol d, MOD A4C: 86.0 ml LV vol s,

## 2021-11-18 NOTE — Progress Notes (Signed)
Nsg Discharge Note ? ?Admit Date:  11/16/2021 ?Discharge date: 11/18/2021 ?  ?Shannon Hendrix to be D/C'd Home per MD order.  AVS completed.  Patient/caregiver able to verbalize understanding. ? ?Discharge Medication: ?Allergies as of 11/18/2021   ? ?   Reactions  ? Nitrofurantoin Rash  ? ?  ? ?  ?Medication List  ?  ? ?STOP taking these medications   ? ?lovastatin 20 MG tablet ?Commonly known as: MEVACOR ?  ?metoprolol tartrate 25 MG tablet ?Commonly known as: LOPRESSOR ?  ? ?  ? ?TAKE these medications   ? ?acetaminophen 500 MG tablet ?Commonly known as: TYLENOL ?Take 1,000 mg by mouth every 6 (six) hours as needed (for pain.). ?  ?amiodarone 200 MG tablet ?Commonly known as: PACERONE ?Take 1 tablet (200 mg total) by mouth daily. ?Start taking on: November 19, 2021 ?  ?apixaban 5 MG Tabs tablet ?Commonly known as: ELIQUIS ?Take 1 tablet (5 mg total) by mouth 2 (two) times daily. ?  ?Fish Oil 1000 MG Caps ?Take 2 capsules by mouth 2 (two) times daily. ?  ?metoprolol succinate 50 MG 24 hr tablet ?Commonly known as: TOPROL-XL ?Take 1 tablet (50 mg total) by mouth daily. Take with or immediately following a meal. ?  ?multivitamin with minerals Tabs tablet ?Take 1 tablet by mouth daily. ?  ?nortriptyline 25 MG capsule ?Commonly known as: PAMELOR ?Take 25 mg by mouth at bedtime. ?  ?omeprazole 20 MG capsule ?Commonly known as: PRILOSEC ?Take 20 mg by mouth daily. ?  ?rosuvastatin 10 MG tablet ?Commonly known as: CRESTOR ?Take 10 mg by mouth daily. ?  ?sertraline 25 MG tablet ?Commonly known as: ZOLOFT ?Take 25 mg by mouth daily. ?  ?vitamin B-12 1000 MCG tablet ?Commonly known as: CYANOCOBALAMIN ?Take 1,000 mcg by mouth daily. ?  ?Vitamin D-3 125 MCG (5000 UT) Tabs ?Take 5,000 Units by mouth daily. ?  ? ?  ? ? ?Discharge Assessment: ?Vitals:  ? 11/18/21 0841 11/18/21 1517  ?BP: 105/81 135/88  ?Pulse: (!) 101 88  ?Resp: 18   ?Temp:    ?SpO2: 90% 93%  ? Skin clean, dry and intact without evidence of skin break down, no  evidence of skin tears noted. ?IV catheter discontinued intact. Site without signs and symptoms of complications - no redness or edema noted at insertion site, patient denies c/o pain - only slight tenderness at site.  Dressing with slight pressure applied. ? ?D/c Instructions-Education: ?Discharge instructions given to patient/family with verbalized understanding. ?D/c education completed with patient/family including follow up instructions, medication list, d/c activities limitations if indicated, with other d/c instructions as indicated by MD - patient able to verbalize understanding, all questions fully answered. ?Patient instructed to return to ED, call 911, or call MD for any changes in condition.  ?Patient escorted via [place], and D/C home via private auto. ? ?Braya Habermehl, Jolene Schimke, RN ?11/18/2021 3:27 PM  ?

## 2021-11-18 NOTE — Plan of Care (Signed)
?  Problem: Health Behavior/Discharge Planning: ?Goal: Ability to safely manage health-related needs after discharge will improve ?Outcome: Adequate for Discharge ?  ?

## 2022-02-18 IMAGING — MG MM DIGITAL SCREENING BILAT W/ TOMO AND CAD
6 of 10 series · 6 of 30 positions shown · non-contrast
Comparison: Previous exam(s).

CLINICAL DATA: Screening.

EXAM:
DIGITAL SCREENING BILATERAL MAMMOGRAM WITH TOMOSYNTHESIS AND CAD
TECHNIQUE: Bilateral screening digital craniocaudal and mediolateral oblique
mammograms were obtained. Bilateral screening digital breast
tomosynthesis was performed. The images were evaluated with
computer-aided detection.

[R MLO synth-2D]
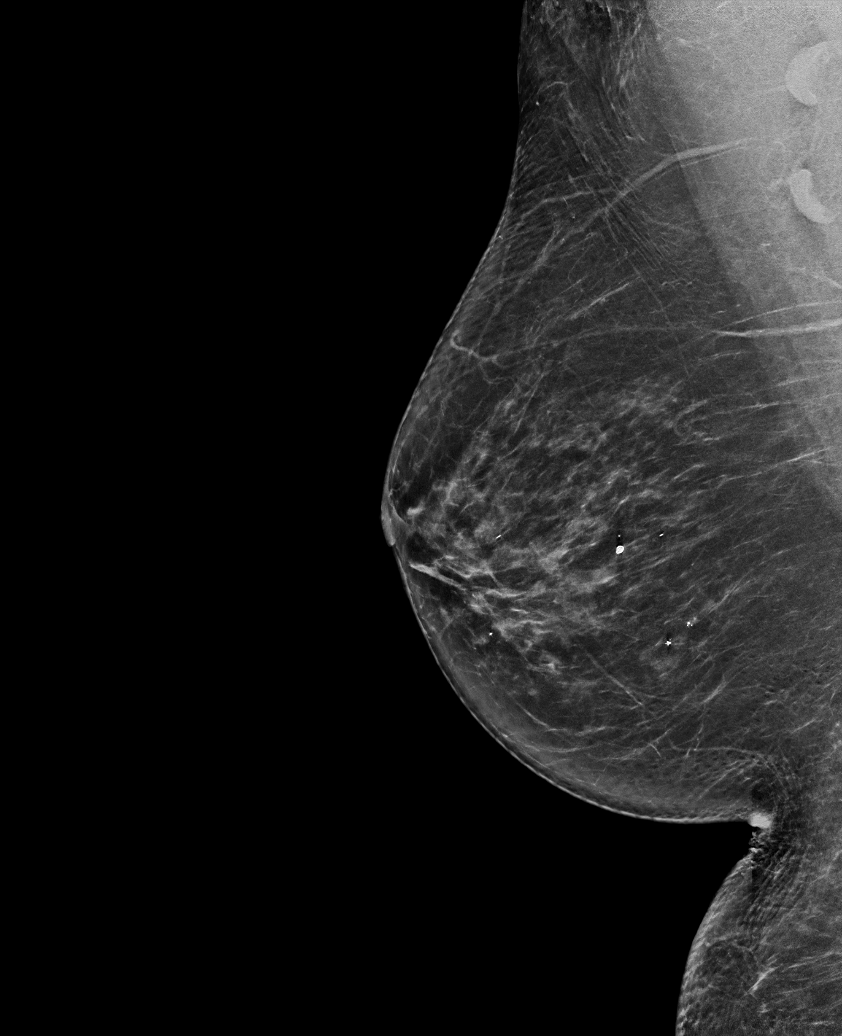

[R CC synth-2D (1 of 2)]
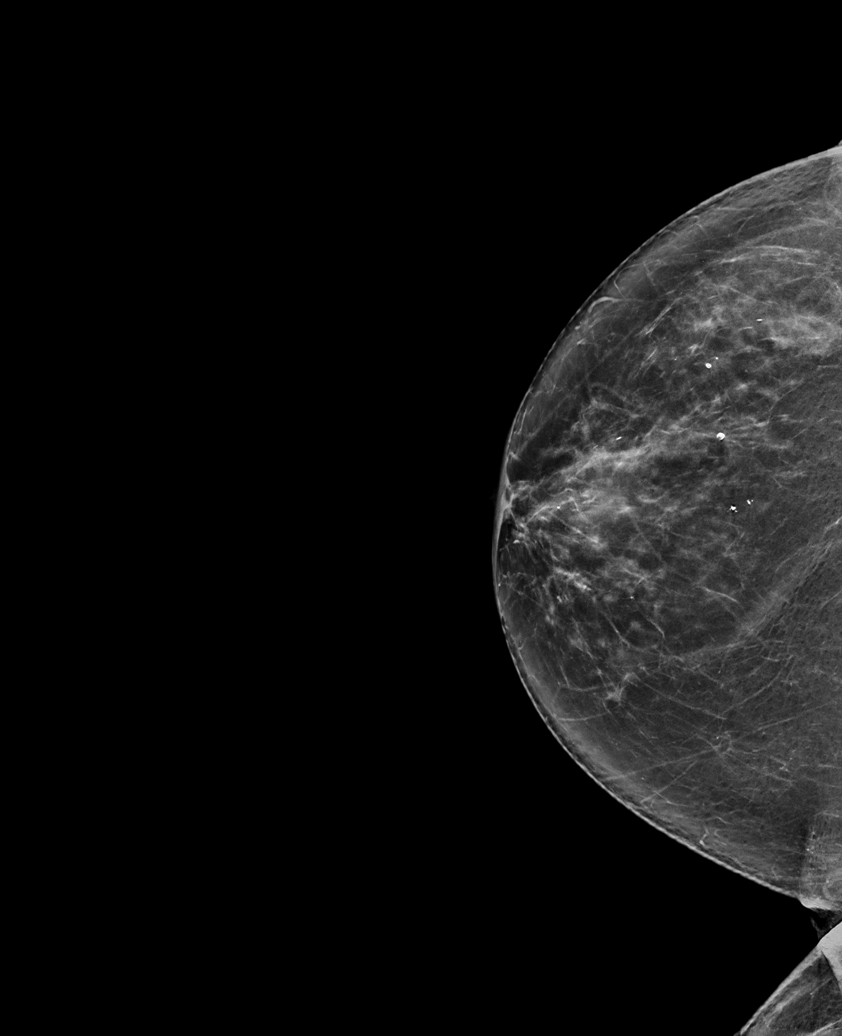

[L CC synth-2D]
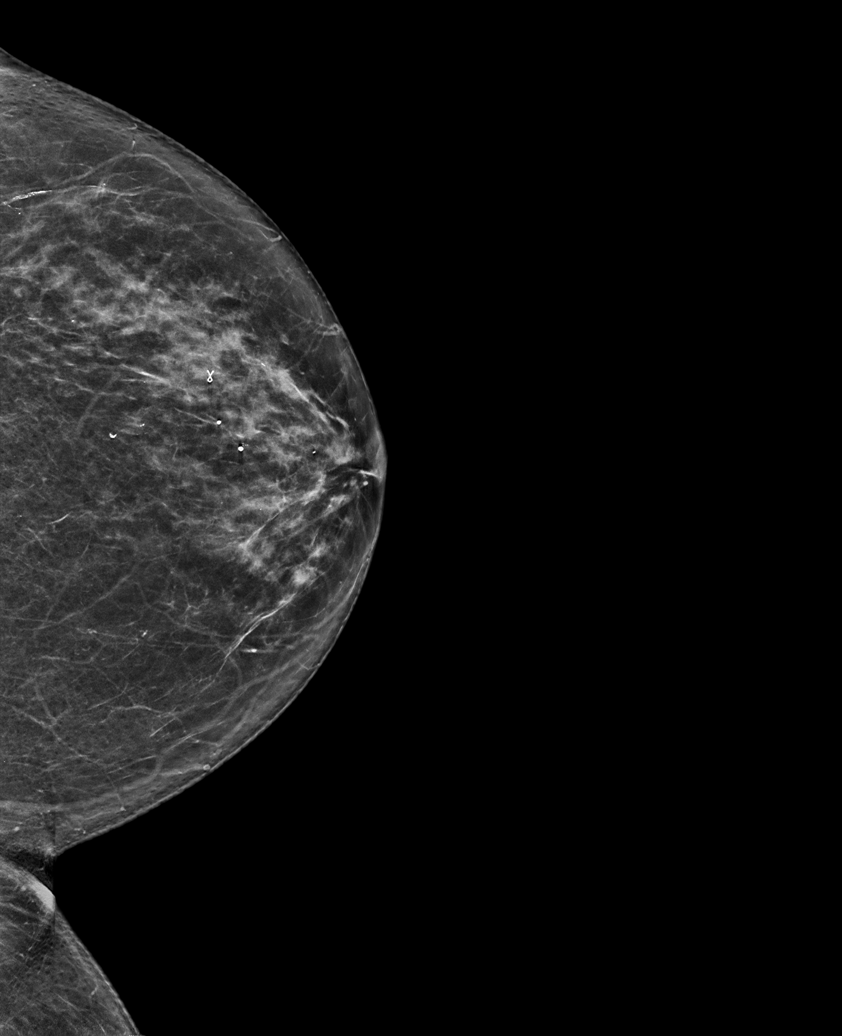

[L MLO synth-2D]
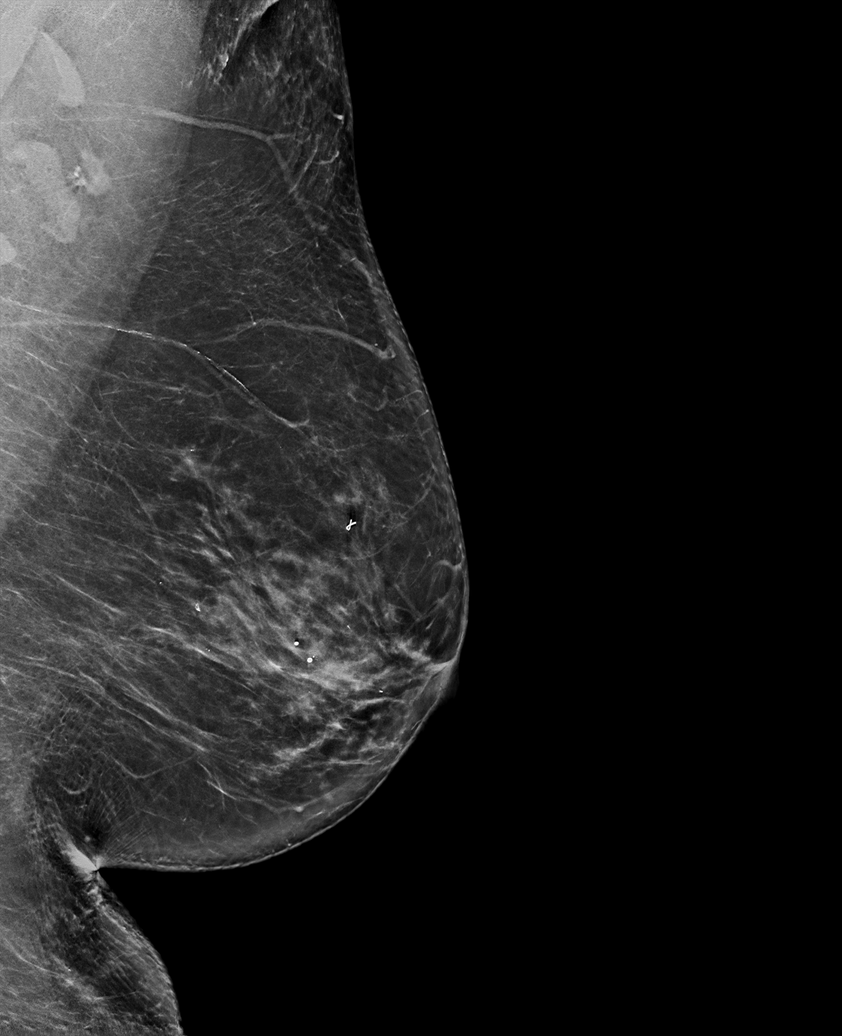

[R CC synth-2D (2 of 2)]
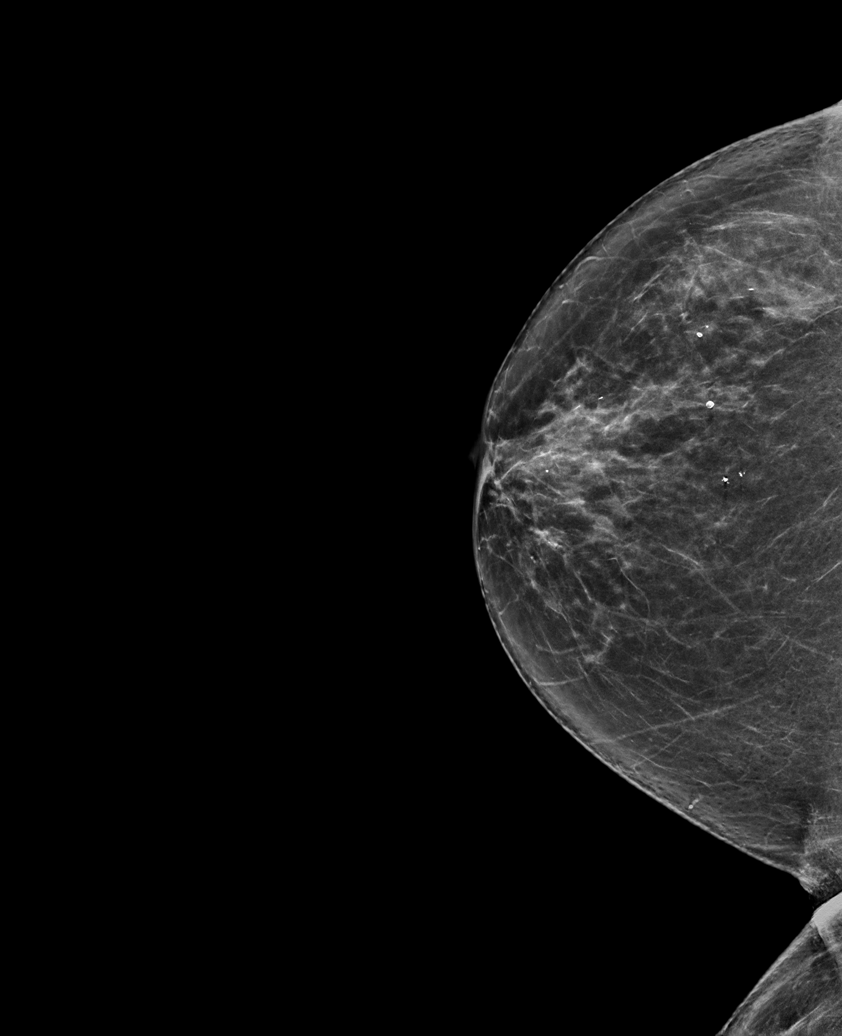

[R CC tomo · tomo slice 37/74.0]
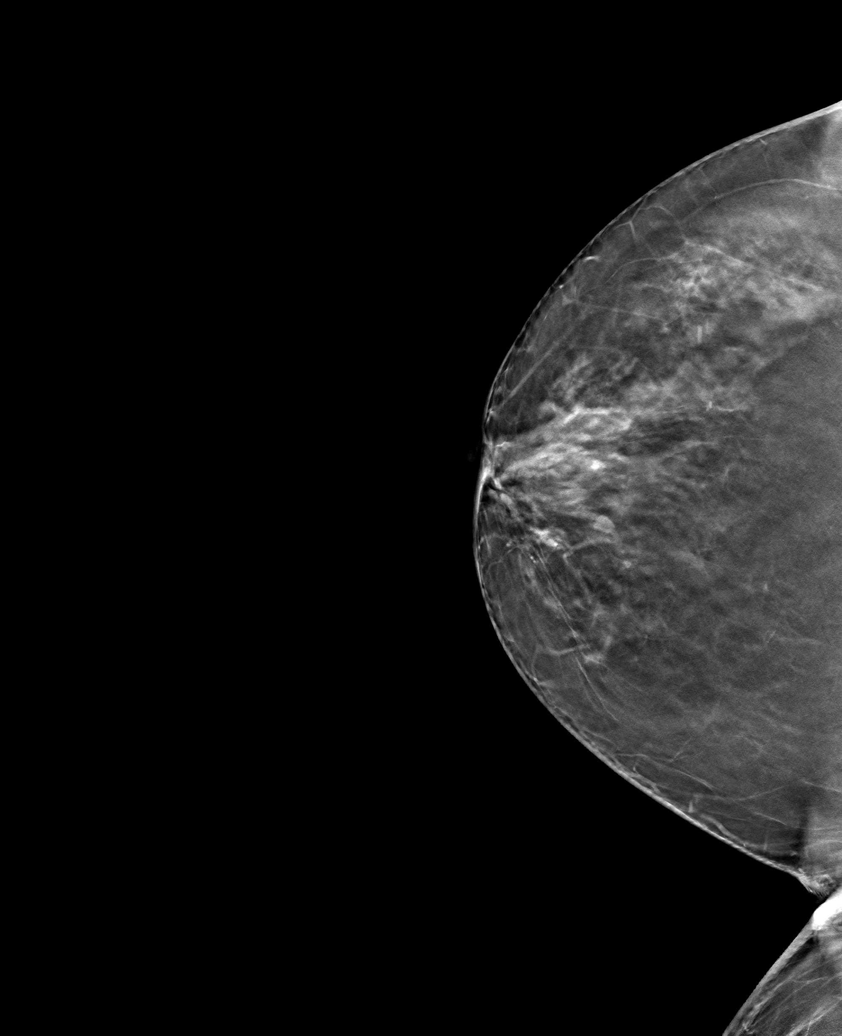

[6 of 30 positions shown; findings below may reference images not displayed]

ACR Breast Density Category c: The breast tissue is heterogeneously
dense, which may obscure small masses.
FINDINGS: There are no findings suspicious for malignancy.
IMPRESSION: No mammographic evidence of malignancy. A result letter of this
screening mammogram will be mailed directly to the patient.

RECOMMENDATION:
Screening mammogram in one year. (Code:Q3-W-BC3)

BI-RADS CATEGORY  1: Negative.

## 2022-04-30 ENCOUNTER — Other Ambulatory Visit: Payer: Self-pay | Admitting: Internal Medicine

## 2022-04-30 DIAGNOSIS — Z1231 Encounter for screening mammogram for malignant neoplasm of breast: Secondary | ICD-10-CM

## 2022-05-31 ENCOUNTER — Ambulatory Visit
Admission: RE | Admit: 2022-05-31 | Discharge: 2022-05-31 | Disposition: A | Discharge status: Home / Self Care | Payer: Medicare Other | Source: Ambulatory Visit | Attending: Internal Medicine | Admitting: Internal Medicine

## 2022-05-31 DIAGNOSIS — Z1231 Encounter for screening mammogram for malignant neoplasm of breast: Secondary | ICD-10-CM | POA: Diagnosis present

## 2022-08-07 IMAGING — DX DG CHEST 1V
1 series · 1 of 1 positions shown · non-contrast
Comparison: None.

CLINICAL DATA: shob

EXAM:
CHEST  1 VIEW

[chest ap]
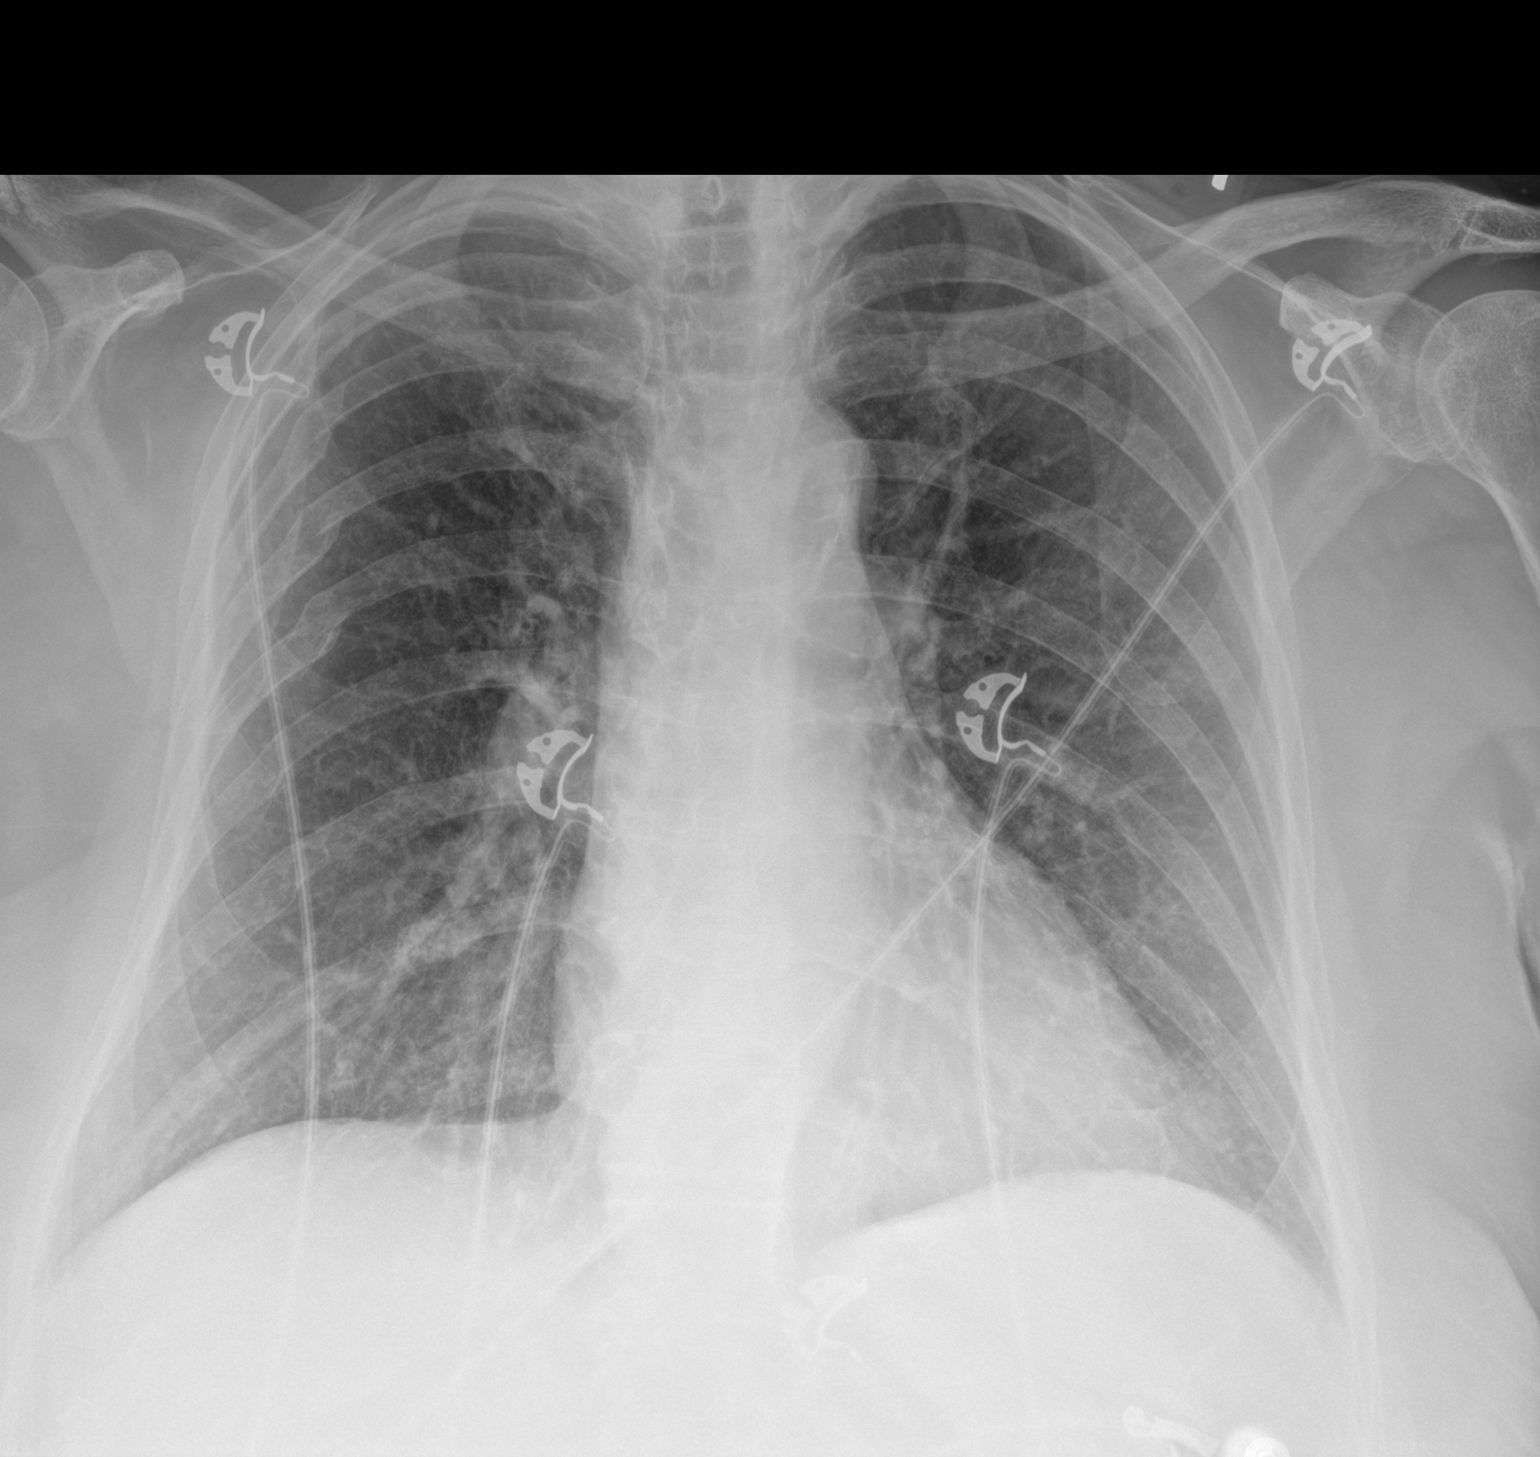

[1 of 1 positions shown; findings below may reference images not displayed]

FINDINGS: The heart and mediastinal contours are within normal limits. Aortic
calcification.

No focal consolidation. Coarsened markings with no overt pulmonary
edema. No pleural effusion. No pneumothorax.

No acute osseous abnormality.  Old healed right rib fractures.
IMPRESSION: 1. No active disease.
2.  Aortic Atherosclerosis ([id number].G).

## 2022-11-18 ENCOUNTER — Emergency Department: Payer: Medicare Other

## 2022-11-18 ENCOUNTER — Other Ambulatory Visit: Payer: Self-pay

## 2022-11-18 ENCOUNTER — Emergency Department
Admission: EM | Admit: 2022-11-18 | Discharge: 2022-11-18 | Disposition: A | Discharge status: Home / Self Care | Payer: Medicare Other | Attending: Emergency Medicine | Admitting: Emergency Medicine

## 2022-11-18 DIAGNOSIS — W01198A Fall on same level from slipping, tripping and stumbling with subsequent striking against other object, initial encounter: Secondary | ICD-10-CM | POA: Diagnosis not present

## 2022-11-18 DIAGNOSIS — T148XXA Other injury of unspecified body region, initial encounter: Secondary | ICD-10-CM | POA: Diagnosis not present

## 2022-11-18 DIAGNOSIS — S0990XA Unspecified injury of head, initial encounter: Secondary | ICD-10-CM

## 2022-11-18 DIAGNOSIS — I10 Essential (primary) hypertension: Secondary | ICD-10-CM | POA: Insufficient documentation

## 2022-11-18 DIAGNOSIS — Z7901 Long term (current) use of anticoagulants: Secondary | ICD-10-CM | POA: Diagnosis not present

## 2022-11-18 DIAGNOSIS — W19XXXA Unspecified fall, initial encounter: Secondary | ICD-10-CM

## 2022-11-18 LAB — COMPREHENSIVE METABOLIC PANEL
ALT: 47 U/L — ABNORMAL HIGH (ref 0–44)
AST: 55 U/L — ABNORMAL HIGH (ref 15–41)
Albumin: 3.8 g/dL (ref 3.5–5.0)
Alkaline Phosphatase: 69 U/L (ref 38–126)
Anion gap: 11 (ref 5–15)
BUN: 13 mg/dL (ref 8–23)
CO2: 26 mmol/L (ref 22–32)
Calcium: 9 mg/dL (ref 8.9–10.3)
Chloride: 100 mmol/L (ref 98–111)
Creatinine, Ser: 0.99 mg/dL (ref 0.44–1.00)
GFR, Estimated: 56 mL/min — ABNORMAL LOW (ref >60)
Glucose, Bld: 117 mg/dL — ABNORMAL HIGH (ref 70–99)
Potassium: 4.1 mmol/L (ref 3.5–5.1)
Sodium: 137 mmol/L (ref 135–145)
Total Bilirubin: 0.8 mg/dL (ref 0.3–1.2)
Total Protein: 7.3 g/dL (ref 6.5–8.1)

## 2022-11-18 LAB — CBC
HCT: 45.8 % (ref 36.0–46.0)
Hemoglobin: 14.8 g/dL (ref 12.0–15.0)
MCH: 30.6 pg (ref 26.0–34.0)
MCHC: 32.3 g/dL (ref 30.0–36.0)
MCV: 94.6 fL (ref 80.0–100.0)
Platelets: 229 10*3/uL (ref 150–400)
RBC: 4.84 MIL/uL (ref 3.87–5.11)
RDW: 12 % (ref 11.5–15.5)
WBC: 6.7 10*3/uL (ref 4.0–10.5)
nRBC: 0 % (ref 0.0–0.2)

## 2022-11-18 LAB — TROPONIN I (HIGH SENSITIVITY): Troponin I (High Sensitivity): 6 ng/L (ref <18)

## 2022-11-18 MED ORDER — METOPROLOL SUCCINATE ER 50 MG PO TB24
50.0000 mg | ORAL_TABLET | Freq: Once | ORAL | Status: AC
  Administered 2022-11-18: 50 mg via ORAL
  Filled 2022-11-18: qty 1

## 2022-11-18 NOTE — ED Provider Notes (Signed)
[facility] Provider Note    Event Date/Time   First MD Initiated Contact with Patient 11/18/22 0730     (approximate)   History   Fall   HPI  Shannon Hendrix is a 89 y.o. female who presents after a fall.  Patient is on Eliquis, she has a history of hypertension.  As well as atrial fibrillation, paroxysmal.  She reports 3 days ago she was getting out of bed in the morning and became dizzy, which sometimes happens to her and she reports she fell to her knees and hit her head on the nightstand.  She denies chest wall injury, orally she does not remember it.  Since that time she has been having intermittent discomfort in her chest bilaterally, she describes it as a cramping sensation, typically occurs when she bends over.  No diaphoresis, no shortness of breath, no pleurisy.  Denies neurodeficits, no headache, no nausea or vomiting.  Has not taken her blood pressure medication yet today     Physical Exam   Triage Vital Signs: ED Triage Vitals  Enc Vitals Group     BP 11/18/22 0726 (!) 212/88     Pulse Rate 11/18/22 0726 82     Resp 11/18/22 0726 20     Temp 11/18/22 0726 98.5 F (36.9 C)     Temp Source 11/18/22 0726 Oral     SpO2 11/18/22 0726 93 %     Weight 11/18/22 0727 77.1 kg (170 lb)     Height 11/18/22 0727 1.651 m ( )     Head Circumference --      Peak Flow --      Pain Score 11/18/22 0727 8     Pain Loc --      Pain Edu? --      Excl. in GC? --     Most recent vital signs: Vitals:   11/18/22 0830 11/18/22 1006  BP: (!) 187/75 (!) 188/68  Pulse: 69 70  Resp:  18  Temp:  98.2 F (36.8 C)  SpO2: 93% 94%     General: Awake, no distress.  CV:  Good peripheral perfusion.  Mild discomfort to palpation of the lower ribs bilaterally, no bruising or rash, no rub with leaning forward Resp:  Normal effort.  Abd:  No distention.  Soft, nontender, no discomfort over the spleen or liver, no pulsatile mass or bruit Other:  Head:  Hematoma, nonbleeding just left of the crown, neuro intact, no neurodeficits.   ED Results / Procedures / Treatments   Labs (all labs ordered are listed, but only abnormal results are displayed) Labs Reviewed  COMPREHENSIVE METABOLIC PANEL - Abnormal; Notable for the following components:      Result Value   Glucose, Bld 117 (*)    AST 55 (*)    ALT 47 (*)    GFR, Estimated 56 (*)    All other components within normal limits  CBC  TROPONIN I (HIGH SENSITIVITY)     EKG  ED ECG REPORT I, Jene Every, the attending physician, personally viewed and interpreted this ECG.  Date: 11/18/2022  Rhythm: normal sinus rhythm QRS Axis: normal Intervals: normal ST/T Wave abnormalities: normal Narrative Interpretation: no evidence of acute ischemia    RADIOLOGY Chest x-ray viewed interpret by me, no acute abnormality    PROCEDURES:  Critical Care performed:   Procedures   MEDICATIONS ORDERED IN ED: Medications  metoprolol succinate (TOPROL-XL) 24 hr tablet 50 mg (50 mg Oral  Given 11/18/22 0820)     IMPRESSION / MDM / ASSESSMENT AND PLAN / ED COURSE  I reviewed the triage vital signs and the nursing notes. Patient's presentation is most consistent with acute presentation with potential threat to life or bodily function.  Patient presents after a fall with head injury on Eliquis/blood thinner.  She also has chest discomfort, questionably related to the fall versus distinct.  Differential includes angina, less likely ACS given reassuring EKG, chest wall contusion, rib fracture, muscle strain  Will obtain cardiac enzymes, labs, chest x-ray, CT head and reevaluate.  CT head is reassuring, chest x-ray unremarkable, lab work cardiac enzymes normal.  Patient remains well-appearing, no indication at this time, appropriate for discharge, return precautions discussed      FINAL CLINICAL IMPRESSION(S) / ED DIAGNOSES   Final diagnoses:  Fall, initial encounter  Injury of  head, initial encounter  Muscle strain     Rx / DC Orders   ED Discharge Orders     None        Note:  This document was prepared using Dragon voice recognition software and may include unintentional dictation errors.   Jene Every, MD 11/18/22 1024

## 2022-11-18 NOTE — ED Notes (Signed)
Pt requesting water, provided to pt after MD gave this tech verbal OK.

## 2022-11-18 NOTE — ED Triage Notes (Signed)
Pt BIB [facility] from home C/O fall 2-3 days ago. Pt reports that she got out of bed, became dizzy, then fell and hit her head on her nightstand and fell to the floor. Reporting bilateral rib cage pain since fall. Endorses blood thinner use. Hx HTN, has not taken meds this AM.

## 2022-11-18 NOTE — ED Notes (Signed)
Pt returned from XRAY 

## 2022-11-18 NOTE — ED Notes (Signed)
Pt taken to XRAY

## 2022-11-18 NOTE — ED Notes (Signed)
Pt taken to CT.

## 2022-11-18 NOTE — ED Notes (Signed)
Pt returned from CT °

## 2022-11-27 ENCOUNTER — Observation Stay: Payer: Medicare Other

## 2022-11-27 ENCOUNTER — Other Ambulatory Visit: Payer: Self-pay

## 2022-11-27 ENCOUNTER — Encounter: Payer: Self-pay | Admitting: Internal Medicine

## 2022-11-27 ENCOUNTER — Observation Stay
Admission: EM | Admit: 2022-11-27 | Discharge: 2022-11-28 | Disposition: A | Discharge status: Home Health | Payer: Medicare Other | Attending: Internal Medicine | Admitting: Internal Medicine

## 2022-11-27 DIAGNOSIS — M810 Age-related osteoporosis without current pathological fracture: Secondary | ICD-10-CM | POA: Diagnosis present

## 2022-11-27 DIAGNOSIS — N1831 Chronic kidney disease, stage 3a: Secondary | ICD-10-CM | POA: Insufficient documentation

## 2022-11-27 DIAGNOSIS — Z87891 Personal history of nicotine dependence: Secondary | ICD-10-CM | POA: Diagnosis not present

## 2022-11-27 DIAGNOSIS — I129 Hypertensive chronic kidney disease with stage 1 through stage 4 chronic kidney disease, or unspecified chronic kidney disease: Secondary | ICD-10-CM | POA: Insufficient documentation

## 2022-11-27 DIAGNOSIS — Z1152 Encounter for screening for COVID-19: Secondary | ICD-10-CM | POA: Diagnosis not present

## 2022-11-27 DIAGNOSIS — R42 Dizziness and giddiness: Secondary | ICD-10-CM | POA: Diagnosis not present

## 2022-11-27 DIAGNOSIS — Z96641 Presence of right artificial hip joint: Secondary | ICD-10-CM | POA: Insufficient documentation

## 2022-11-27 DIAGNOSIS — Z79899 Other long term (current) drug therapy: Secondary | ICD-10-CM | POA: Diagnosis not present

## 2022-11-27 DIAGNOSIS — M169 Osteoarthritis of hip, unspecified: Secondary | ICD-10-CM | POA: Diagnosis present

## 2022-11-27 DIAGNOSIS — Z7901 Long term (current) use of anticoagulants: Secondary | ICD-10-CM | POA: Insufficient documentation

## 2022-11-27 DIAGNOSIS — N3000 Acute cystitis without hematuria: Secondary | ICD-10-CM | POA: Diagnosis not present

## 2022-11-27 DIAGNOSIS — I480 Paroxysmal atrial fibrillation: Secondary | ICD-10-CM | POA: Insufficient documentation

## 2022-11-27 DIAGNOSIS — E785 Hyperlipidemia, unspecified: Secondary | ICD-10-CM | POA: Diagnosis present

## 2022-11-27 DIAGNOSIS — S8012XA Contusion of left lower leg, initial encounter: Secondary | ICD-10-CM | POA: Insufficient documentation

## 2022-11-27 DIAGNOSIS — R0602 Shortness of breath: Secondary | ICD-10-CM | POA: Diagnosis present

## 2022-11-27 DIAGNOSIS — K219 Gastro-esophageal reflux disease without esophagitis: Secondary | ICD-10-CM | POA: Diagnosis present

## 2022-11-27 DIAGNOSIS — F32A Depression, unspecified: Secondary | ICD-10-CM | POA: Diagnosis present

## 2022-11-27 LAB — URINALYSIS, ROUTINE W REFLEX MICROSCOPIC
Bilirubin Urine: NEGATIVE
Glucose, UA: NEGATIVE mg/dL
Hgb urine dipstick: NEGATIVE
Ketones, ur: NEGATIVE mg/dL
Nitrite: NEGATIVE
Protein, ur: NEGATIVE mg/dL
Specific Gravity, Urine: 1.005 (ref 1.005–1.030)
pH: 7 (ref 5.0–8.0)

## 2022-11-27 LAB — CBC
HCT: 42.9 % (ref 36.0–46.0)
Hemoglobin: 14.3 g/dL (ref 12.0–15.0)
MCH: 30.9 pg (ref 26.0–34.0)
MCHC: 33.3 g/dL (ref 30.0–36.0)
MCV: 92.7 fL (ref 80.0–100.0)
Platelets: 295 10*3/uL (ref 150–400)
RBC: 4.63 MIL/uL (ref 3.87–5.11)
RDW: 12 % (ref 11.5–15.5)
WBC: 8.9 10*3/uL (ref 4.0–10.5)
nRBC: 0 % (ref 0.0–0.2)

## 2022-11-27 LAB — COMPREHENSIVE METABOLIC PANEL
ALT: 33 U/L (ref 0–44)
AST: 36 U/L (ref 15–41)
Albumin: 3.8 g/dL (ref 3.5–5.0)
Alkaline Phosphatase: 90 U/L (ref 38–126)
Anion gap: 12 (ref 5–15)
BUN: 16 mg/dL (ref 8–23)
CO2: 22 mmol/L (ref 22–32)
Calcium: 9.2 mg/dL (ref 8.9–10.3)
Chloride: 103 mmol/L (ref 98–111)
Creatinine, Ser: 1.09 mg/dL — ABNORMAL HIGH (ref 0.44–1.00)
GFR, Estimated: 49 mL/min — ABNORMAL LOW (ref >60)
Glucose, Bld: 115 mg/dL — ABNORMAL HIGH (ref 70–99)
Potassium: 3.8 mmol/L (ref 3.5–5.1)
Sodium: 137 mmol/L (ref 135–145)
Total Bilirubin: 1.2 mg/dL (ref 0.3–1.2)
Total Protein: 7.5 g/dL (ref 6.5–8.1)

## 2022-11-27 LAB — SARS CORONAVIRUS 2 BY RT PCR: SARS Coronavirus 2 by RT PCR: NEGATIVE

## 2022-11-27 LAB — TROPONIN I (HIGH SENSITIVITY): Troponin I (High Sensitivity): 6 ng/L (ref <18)

## 2022-11-27 LAB — CK: Total CK: 53 U/L (ref 38–234)

## 2022-11-27 LAB — PROCALCITONIN: Procalcitonin: <0.1 ng/mL

## 2022-11-27 MED ORDER — PRAVASTATIN SODIUM 20 MG PO TABS: 20.0000 mg | ORAL_TABLET | Freq: Every day | ORAL | Status: DC

## 2022-11-27 MED ORDER — PANTOPRAZOLE SODIUM 40 MG PO TBEC
40.0000 mg | DELAYED_RELEASE_TABLET | Freq: Every day | ORAL | Status: DC
  Administered 2022-11-27 – 2022-11-28 (×2): 40 mg via ORAL
  Filled 2022-11-27 (×2): qty 1

## 2022-11-27 MED ORDER — ONDANSETRON HCL 4 MG PO TABS
4.0000 mg | ORAL_TABLET | Freq: Four times a day (QID) | ORAL | Status: DC | PRN
  Filled 2022-11-27: qty 1

## 2022-11-27 MED ORDER — SODIUM CHLORIDE 0.9 % IV BOLUS
500.0000 mL | Freq: Once | INTRAVENOUS | Status: AC
  Administered 2022-11-27: 500 mL via INTRAVENOUS

## 2022-11-27 MED ORDER — NORTRIPTYLINE HCL 25 MG PO CAPS
25.0000 mg | ORAL_CAPSULE | Freq: Every day | ORAL | Status: DC
  Administered 2022-11-27: 25 mg via ORAL
  Filled 2022-11-27: qty 1

## 2022-11-27 MED ORDER — SODIUM CHLORIDE 0.9 % IV SOLN
1.0000 g | Freq: Once | INTRAVENOUS | Status: AC
  Administered 2022-11-27: 1 g via INTRAVENOUS
  Filled 2022-11-27: qty 10

## 2022-11-27 MED ORDER — ONDANSETRON HCL 4 MG/2ML IJ SOLN: 4.0000 mg | Freq: Four times a day (QID) | INTRAMUSCULAR | Status: DC | PRN

## 2022-11-27 MED ORDER — APIXABAN 5 MG PO TABS
5.0000 mg | ORAL_TABLET | Freq: Two times a day (BID) | ORAL | Status: DC
  Administered 2022-11-27 – 2022-11-28 (×2): 5 mg via ORAL
  Filled 2022-11-27 (×2): qty 1

## 2022-11-27 MED ORDER — AMIODARONE HCL 200 MG PO TABS
100.0000 mg | ORAL_TABLET | Freq: Every day | ORAL | Status: DC
  Administered 2022-11-27 – 2022-11-28 (×2): 100 mg via ORAL
  Filled 2022-11-27 (×2): qty 1

## 2022-11-27 MED ORDER — VITAMIN D 25 MCG (1000 UNIT) PO TABS
5000.0000 [IU] | ORAL_TABLET | Freq: Every day | ORAL | Status: DC
  Administered 2022-11-27 – 2022-11-28 (×2): 5000 [IU] via ORAL
  Filled 2022-11-27 (×4): qty 5

## 2022-11-27 MED ORDER — ACETAMINOPHEN 325 MG PO TABS: 650.0000 mg | ORAL_TABLET | Freq: Four times a day (QID) | ORAL | Status: DC | PRN

## 2022-11-27 MED ORDER — CYANOCOBALAMIN 500 MCG PO TABS
1000.0000 ug | ORAL_TABLET | Freq: Every day | ORAL | Status: DC
  Administered 2022-11-27 – 2022-11-28 (×2): 1000 ug via ORAL
  Filled 2022-11-27 (×2): qty 2

## 2022-11-27 MED ORDER — SODIUM CHLORIDE 0.9 % IV SOLN: 1.0000 g | INTRAVENOUS | Status: DC

## 2022-11-27 MED ORDER — ENOXAPARIN SODIUM 40 MG/0.4ML IJ SOSY: 40.0000 mg | PREFILLED_SYRINGE | INTRAMUSCULAR | Status: DC

## 2022-11-27 MED ORDER — METOPROLOL SUCCINATE ER 50 MG PO TB24
50.0000 mg | ORAL_TABLET | Freq: Every day | ORAL | Status: DC
  Administered 2022-11-27 – 2022-11-28 (×2): 50 mg via ORAL
  Filled 2022-11-27 (×2): qty 1

## 2022-11-27 MED ORDER — ACETAMINOPHEN 650 MG RE SUPP: 650.0000 mg | Freq: Four times a day (QID) | RECTAL | Status: DC | PRN

## 2022-11-27 NOTE — Assessment & Plan Note (Signed)
With anxiety 

## 2022-11-27 NOTE — Assessment & Plan Note (Addendum)
With dizziness, increasing fall With hypotension Check orthostatic vitals, with message for nursing to document Check portable chest x-ray, EKG, procalcitonin Complete echo ordered to assess for valvular abnormality If the workup is negative would recommend a.m. team to consider discharging patient home with Holter monitor to assess for dysrhythmia Admit to telemetry cardiac, observation

## 2022-11-27 NOTE — ED Triage Notes (Addendum)
[facility], c/o SOB and weakness x1HR, 12 lead a-fib 100-110s. 99% on RA. VSS. Fall last week, hit table, c/o R hip pain and hit head. CT scan was performed. On blood thinners. PMH: a-fib. GCS: 15

## 2022-11-27 NOTE — ED Provider Notes (Signed)
[facility] Provider Note    Event Date/Time   First MD Initiated Contact with Patient 11/27/22 [id number]     (approximate)   History   Shortness of Breath and Weakness   HPI  Shannon Hendrix is a 89 y.o. female with a history of paroxysmal atrial fibrillation on blood thinners who presents after a episode of weakness this morning.  Patient reports she felt like she was going to faint after drinking her coffee.  She denies chest pain.  During this episode she was feeling somewhat short of breath.  No pleurisy.  No fevers or cough reported.  Reports compliance with her medications, no new medications     Physical Exam   Triage Vital Signs: ED Triage Vitals [11/27/22 0900]  Enc Vitals Group     BP (!) 135/95     Pulse Rate 93     Resp (!) 22     Temp 98.3 F (36.8 C)     Temp Source Oral     SpO2 100 %     Weight      Height      Head Circumference      Peak Flow      Pain Score      Pain Loc      Pain Edu?      Excl. in GC?     Most recent vital signs: Vitals:   11/27/22 1120 11/27/22 1121  BP: (!) 179/87 122/78  Pulse: 88 91  Resp: (!) 22 19  Temp:    SpO2: 97% 99%     General: Awake, no distress.  CV:  Good peripheral perfusion.  Irregularly irregular rhythm Resp:  Mild tachypnea Abd:  No distention.  Soft, nontender Other:  Patient has bruising to the right hip and right thigh, she reports she bumped into a table nearly a week ago, she has been ambulating well since then.   ED Results / Procedures / Treatments   Labs (all labs ordered are listed, but only abnormal results are displayed) Labs Reviewed  COMPREHENSIVE METABOLIC PANEL - Abnormal; Notable for the following components:      Result Value   Glucose, Bld 115 (*)    Creatinine, Ser 1.09 (*)    GFR, Estimated 49 (*)    All other components within normal limits  URINALYSIS, ROUTINE W REFLEX MICROSCOPIC - Abnormal; Notable for the following components:   Color, Urine  YELLOW (*)    APPearance CLEAR (*)    Leukocytes,Ua LARGE (*)    Bacteria, UA RARE (*)    All other components within normal limits  SARS CORONAVIRUS 2 BY RT PCR  CBC  PROCALCITONIN  TROPONIN I (HIGH SENSITIVITY)     EKG  ED ECG REPORT I, Jene Every, the attending physician, personally viewed and interpreted this ECG.  Date: 11/27/2022  Rhythm: normal sinus rhythm QRS Axis: normal Intervals: normal ST/T Wave abnormalities: normal Narrative Interpretation: Premature atrial beats    RADIOLOGY Chest x-ray viewed interpreted by me, no acute abnormality    PROCEDURES:  Critical Care performed:   Procedures   MEDICATIONS ORDERED IN ED: Medications  acetaminophen (TYLENOL) tablet 650 mg (has no administration in time range)    Or  acetaminophen (TYLENOL) suppository 650 mg (has no administration in time range)  ondansetron (ZOFRAN) tablet 4 mg (has no administration in time range)    Or  ondansetron (ZOFRAN) injection 4 mg (has no administration in time range)  enoxaparin (LOVENOX) injection  40 mg (has no administration in time range)  cefTRIAXone (ROCEPHIN) 1 g in sodium chloride 0.9 % 100 mL IVPB (has no administration in time range)  sodium chloride 0.9 % bolus 500 mL (0 mLs Intravenous Stopped 11/27/22 1125)  sodium chloride 0.9 % bolus 500 mL (0 mLs Intravenous Stopped 11/27/22 1227)  cefTRIAXone (ROCEPHIN) 1 g in sodium chloride 0.9 % 100 mL IVPB (0 g Intravenous Stopped 11/27/22 1338)     IMPRESSION / MDM / ASSESSMENT AND PLAN / ED COURSE  I reviewed the triage vital signs and the nursing notes. Patient's presentation is most consistent with acute presentation with potential threat to life or bodily function.  Patient presents with near syncopal episode which occurred just prior to arrival.  EMS noted that the patient appeared to be in atrial fibrillation although rate was fairly well-controlled.  Differential includes atrial fibrillation with RVR,  dehydration, vasovagal episode, less likely ACS  Will place the patient on cardiac monitor, obtain EKG, labs, place IV give IV fluid bolus  ----------------------------------------- 11:15 AM on 11/27/2022 ----------------------------------------- Test results reassuring, urinalysis with positive leukocytes, some bacteria  Patient continues to feel mildly dizzy, will give additional bolus then check orthostatics.  IV Rocephin for UTI ----------------------------------------- 12:18 PM on 11/27/2022 ----------------------------------------- Even after fluids patient is orthostatic, she is at high risk for fall, will discuss with the hospitalist for admission      FINAL CLINICAL IMPRESSION(S) / ED DIAGNOSES   Final diagnoses:  Orthostatic dizziness  Acute cystitis without hematuria     Rx / DC Orders   ED Discharge Orders     None        Note:  This document was prepared using Dragon voice recognition software and may include unintentional dictation errors.   Jene Every, MD 11/27/22 [phone]

## 2022-11-27 NOTE — Assessment & Plan Note (Addendum)
Eliquis 5 mg p.o. twice daily, metoprolol 50 mg daily, amiodarone 100 mg daily were resumed

## 2022-11-27 NOTE — Assessment & Plan Note (Signed)
PPI ?

## 2022-11-27 NOTE — ED Notes (Signed)
Pt removed from bedpan, pericare performed and new chuck pad placed. Pt repositioned in the bed and bed was left in the lowest, locked position.

## 2022-11-27 NOTE — ED Notes (Signed)
Pt placed on bedpan. Tolerated well. 

## 2022-11-27 NOTE — Hospital Course (Signed)
Ms. Bentleigh Waren is a 89 year old female with history of paroxysmal atrial fibrillation on Eliquis, hypertension, GERD, hyperlipidemia, depression with anxiety, osteoporosis, CKD stage IIIa, who presents to the emergency department for chief concerns of weakness, shortness of breath.  Vitals show temperature 98.3, respiration rate of 22, heart rate 93, blood pressure 135/95, SpO2 of 100% on room air.  Serum sodium is 137, potassium 3.8, chloride 103, bicarb 22, BUN of 16, serum creatinine 1.09, EGFR 49, nonfasting glucose 115, WBC 8.9, hemoglobin 14.3, platelets of 295.  UA was positive for large leukocytes.  ED treatment: Ceftriaxone 1 g IV one-time dose, sodium chloride 500 mL bolus x 2

## 2022-11-27 NOTE — Assessment & Plan Note (Signed)
Patient denies having significant pain with my palpation Check CK Low risk for compartment syndrome at this time, I suspect this is secondary to chronic Eliquis use and following

## 2022-11-27 NOTE — Assessment & Plan Note (Signed)
Fall precautions.  

## 2022-11-27 NOTE — Assessment & Plan Note (Signed)
Home statin medication resumed

## 2022-11-27 NOTE — Assessment & Plan Note (Signed)
-   Pravastatin 20 mg daily resumed 

## 2022-11-27 NOTE — H&P (Signed)
History and Physical   Shannon Hendrix ATF:[id number] DOB: Aug 03, 1933 DOA: 11/27/2022  PCP: Lynnea Ferrier, MD  Outpatient Specialists: Dr. Olevia Perches clinic cardiology Patient coming from: Home  I have personally briefly reviewed patient's old medical records in [facility] EMR.  Chief Concern: Shortness of breath, weakness  HPI: Shannon Hendrix is a 89 year old female with history of paroxysmal atrial fibrillation on Eliquis, hypertension, GERD, hyperlipidemia, depression with anxiety, osteoporosis, CKD stage IIIa, who presents to the emergency department for chief concerns of weakness, shortness of breath.  Vitals show temperature 98.3, respiration rate of 22, heart rate 93, blood pressure 135/95, SpO2 of 100% on room air.  Serum sodium is 137, potassium 3.8, chloride 103, bicarb 22, BUN of 16, serum creatinine 1.09, EGFR 49, nonfasting glucose 115, WBC 8.9, hemoglobin 14.3, platelets of 295.  UA was positive for large leukocytes.  ED treatment: Ceftriaxone 1 g IV one-time dose, sodium chloride 500 mL bolus x 2 --------------------------------- At bedside, patient was able to tell me her name, age, current calendar, current year.   She reports that over the last few days she has had increasing fall, and she feels very weak and dizzy.  Patient reports that she had a fall on 11/18/2022.  She reports dizziness upon standing.  She endorses some shortness of breath with exertion.  She reports she did not hit her head on 11/18/22, however she denies loss of consciousness.  She denies chest pain, abdominal pain, dysuria, hematuria, diarrhea.  Social history: She lives at home by herself.  She denies tobacco, EtOH, recreational drugs.  ROS: Constitutional: no weight change, no fever ENT/Mouth: no sore throat, no rhinorrhea Eyes: no eye pain, no vision changes Cardiovascular: no chest pain, + dyspnea,  no edema, no palpitations Respiratory: no cough, no sputum, no  wheezing Gastrointestinal: no nausea, no vomiting, no diarrhea, no constipation Genitourinary: no urinary incontinence, no dysuria, no hematuria Musculoskeletal: no arthralgias, no myalgias Skin: no skin lesions, no pruritus, Neuro: + weakness, no loss of consciousness, no syncope, + dizziness Psych: no anxiety, no depression, + decrease appetite Heme/Lymph: no bruising, no bleeding  ED Course: Discussed with emergency medicine provider, patient requiring hospitalization for chief concerns of shortness of breath with hypotension responsive to IV fluid.  Assessment/Plan  Principal Problem:   Shortness of breath Active Problems:   Dyslipidemia   Osteoporosis, post-menopausal   OA (osteoarthritis) of hip   GERD (gastroesophageal reflux disease)   Hyperlipidemia, unspecified   Depression   Paroxysmal atrial fibrillation (HCC)   Traumatic ecchymosis of left lower leg   Assessment and Plan:  * Shortness of breath With dizziness, increasing fall With hypotension Check orthostatic vitals, with message for nursing to document Check portable chest x-ray, EKG, procalcitonin Complete echo ordered to assess for valvular abnormality If the workup is negative would recommend a.m. team to consider discharging patient home with Holter monitor to assess for dysrhythmia Admit to telemetry cardiac, observation  Dyslipidemia Home statin medication resumed  Traumatic ecchymosis of left lower leg Patient denies having significant pain with my palpation Check CK Low risk for compartment syndrome at this time, I suspect this is secondary to chronic Eliquis use and following  Paroxysmal atrial fibrillation (HCC) Eliquis 5 mg p.o. twice daily, metoprolol 50 mg daily, amiodarone 100 mg daily were resumed   Depression With anxiety  Hyperlipidemia, unspecified Pravastatin 20 mg daily resumed  GERD (gastroesophageal reflux disease) PPI  Osteoporosis, post-menopausal Fall precautions  Knot  on left parietal/temporal bone,  approximately 2x5 cm, negative for ecchymosis, bleeding  Chart reviewed.   DVT prophylaxis: Enoxaparin Code Status: Full code, unless terminally ill Diet: Heart healthy Family Communication: No Disposition Plan: Pending clinical course Consults called: None at this time Admission status: Telemetry cardiac, observation  Past Medical History:  Diagnosis Date   Arthritis    Cardiac arrhythmia    Dysrhythmia    Esophageal stricture    GERD (gastroesophageal reflux disease)    Hiatal hernia    HLD (hyperlipidemia)    Past Surgical History:  Procedure Laterality Date   ABDOMINAL HYSTERECTOMY     BREAST BIOPSY Left 04/18/14   BENIGN BREAST TISSUE WITH NODULAR FIBROSIS.    EYE SURGERY Bilateral    TOTAL HIP ARTHROPLASTY Right 09/24/2017   Procedure: TOTAL HIP ARTHROPLASTY;  Surgeon: Donato Heinz, MD;  Location: [facility] ORS;  Service: Orthopedics;  Laterality: Right;   Social History:  reports that she quit smoking about 61 years ago. Her smoking use included cigarettes. She has a 1.50 pack-year smoking history. She has never used smokeless tobacco. She reports that she does not drink alcohol and does not use drugs.  Allergies  Allergen Reactions   Rosuvastatin Other (See Comments)   Niacin Rash   Nitrofurantoin Rash   Family History  Problem Relation Age of Onset   Bone cancer Sister    Heart attack Brother    Breast cancer Neg Hx    Family history: Family history reviewed and not pertinent.  Prior to Admission medications   Medication Sig Start Date End Date Taking? Authorizing Provider  acetaminophen (TYLENOL) 500 MG tablet Take 1,000 mg by mouth every 6 (six) hours as needed (for pain.).    [provider]  amiodarone (PACERONE) 200 MG tablet Take 1 tablet (200 mg total) by mouth daily. 11/19/21 12/19/21  Charise Killian, MD  apixaban (ELIQUIS) 5 MG TABS tablet Take 1 tablet (5 mg total) by mouth 2 (two) times daily. 11/18/21  12/18/21  Charise Killian, MD  Cholecalciferol (VITAMIN D-3) 5000 units TABS Take 5,000 Units by mouth daily.    [provider]  metoprolol succinate (TOPROL-XL) 50 MG 24 hr tablet Take 1 tablet (50 mg total) by mouth daily. Take with or immediately following a meal. 11/18/21 12/18/21  Charise Killian, MD  Multiple Vitamin (MULTIVITAMIN WITH MINERALS) TABS tablet Take 1 tablet by mouth daily.    [provider]  nortriptyline (PAMELOR) 25 MG capsule Take 25 mg by mouth at bedtime. 09/11/21   [provider]  Omega-3 Fatty Acids (FISH OIL) 1000 MG CAPS Take 2 capsules by mouth 2 (two) times daily.     [provider]  omeprazole (PRILOSEC) 20 MG capsule Take 20 mg by mouth daily.    [provider]  rosuvastatin (CRESTOR) 10 MG tablet Take 10 mg by mouth daily. 07/06/21   [provider]  sertraline (ZOLOFT) 25 MG tablet Take 25 mg by mouth daily. 07/06/21   [provider]  vitamin B-12 (CYANOCOBALAMIN) 1000 MCG tablet Take 1,000 mcg by mouth daily.    [provider]    Physical Exam: Vitals:   11/27/22 1121 11/27/22 1533 11/27/22 1534 11/27/22 1730  BP: 122/78 (!) 170/72  (!) 167/89  Pulse: 91 95  (!) 104  Resp: 19 16 17  (!) 21  Temp:  98.2 F (36.8 C)    TempSrc:  Oral    SpO2: 99% 98%  99%   Constitutional: appears age-appropriate, frail, NAD, calm Eyes:  PERRL, lids and conjunctivae normal ENMT: Mucous membranes are moist. Posterior pharynx clear of any exudate or lesions. Age-appropriate dentition. Hearing appropriate Neck: normal, supple, no masses, no thyromegaly Respiratory: clear to auscultation bilaterally, no wheezing, no crackles. Normal respiratory effort. No accessory muscle use.  Cardiovascular: Regular rate and rhythm, no murmurs / rubs / gallops. No extremity edema. 2+ pedal pulses. No carotid bruits.  Abdomen: no tenderness, no masses palpated, no hepatosplenomegaly. Bowel sounds positive.   Musculoskeletal: no clubbing / cyanosis. No joint deformity upper and lower extremities. Good ROM, no contractures, no atrophy. Normal muscle tone.  Skin: no rashes, lesions, ulcers. No induration. Not on left parietal/temporal bone, approximately 2x5 cm.  Significant ecchymosis of the right lower extremity        Neurologic: Sensation intact. Strength 5/5 in all 4.  Psychiatric: Normal judgment and insight. Alert and oriented x 3. Normal mood.   EKG: independently reviewed, showing sinus rhythm, PVC, rate of 87, QTc 462  Chest x-ray on Admission: I personally reviewed and I agree with radiologist reading as below.  DG Chest Port 1 View  Result Date: 11/27/2022 CLINICAL DATA:  Shortness of breath EXAM: PORTABLE CHEST 1 VIEW COMPARISON:  CXR 11/18/22 FINDINGS: No pleural effusion. No pneumothorax. Hazy bibasilar airspace opacities are nonspecific and could represent atelectasis or infection. Normal cardiac and mediastinal contours. Chronic posterior right 5 through 6 rib fractures. IMPRESSION: Hazy bibasilar airspace opacities are nonspecific and could represent atelectasis or infection. Electronically Signed   By: Lorenza Cambridge M.D.   On: 11/27/2022 13:18    Labs on Admission: I have personally reviewed following labs  CBC: Recent Labs  Lab 11/27/22 0931  WBC 8.9  HGB 14.3  HCT 42.9  MCV 92.7  PLT 295   Basic Metabolic Panel: Recent Labs  Lab 11/27/22 0931  NA 137  K 3.8  CL 103  CO2 22  GLUCOSE 115*  BUN 16  CREATININE 1.09*  CALCIUM 9.2   GFR: Estimated Creatinine Clearance: 38 mL/min (A) (by C-G formula based on SCr of 1.09 mg/dL (H)).  Liver Function Tests: Recent Labs  Lab 11/27/22 0931  AST 36  ALT 33  ALKPHOS 90  BILITOT 1.2  PROT 7.5  ALBUMIN 3.8   Urine analysis:    Component Value Date/Time   COLORURINE YELLOW (A) 11/27/2022 1013   APPEARANCEUR CLEAR (A) 11/27/2022 1013   LABSPEC 1.005 11/27/2022 1013   PHURINE 7.0 11/27/2022 1013   GLUCOSEU  NEGATIVE 11/27/2022 1013   HGBUR NEGATIVE 11/27/2022 1013   BILIRUBINUR NEGATIVE 11/27/2022 1013   KETONESUR NEGATIVE 11/27/2022 1013   PROTEINUR NEGATIVE 11/27/2022 1013   NITRITE NEGATIVE 11/27/2022 1013   LEUKOCYTESUR LARGE (A) 11/27/2022 1013   This document was prepared using Dragon Voice Recognition software and may include unintentional dictation errors.  Dr. Sedalia Muta [facility]  If 7PM-7AM, please contact overnight-coverage provider If 7AM-7PM, please contact day attending provider [email or web address]  11/27/2022, 7:45 PM

## 2022-11-28 ENCOUNTER — Observation Stay (HOSPITAL_BASED_OUTPATIENT_CLINIC_OR_DEPARTMENT_OTHER)
Admit: 2022-11-28 | Discharge: 2022-11-28 | Disposition: A | Discharge status: Home / Self Care | Payer: Medicare Other | Attending: Internal Medicine | Admitting: Internal Medicine

## 2022-11-28 ENCOUNTER — Observation Stay: Payer: Medicare Other

## 2022-11-28 DIAGNOSIS — R55 Syncope and collapse: Secondary | ICD-10-CM

## 2022-11-28 DIAGNOSIS — R0602 Shortness of breath: Secondary | ICD-10-CM | POA: Diagnosis not present

## 2022-11-28 LAB — CBC
HCT: 37.6 % (ref 36.0–46.0)
Hemoglobin: 12.3 g/dL (ref 12.0–15.0)
MCH: 30.7 pg (ref 26.0–34.0)
MCHC: 32.7 g/dL (ref 30.0–36.0)
MCV: 93.8 fL (ref 80.0–100.0)
Platelets: 260 10*3/uL (ref 150–400)
RBC: 4.01 MIL/uL (ref 3.87–5.11)
RDW: 12.2 % (ref 11.5–15.5)
WBC: 9 10*3/uL (ref 4.0–10.5)
nRBC: 0 % (ref 0.0–0.2)

## 2022-11-28 LAB — ECHOCARDIOGRAM COMPLETE
AR max vel: 2.56 cm2
AV Area VTI: 2.69 cm2
AV Area mean vel: 2.49 cm2
AV Mean grad: 3 mmHg
AV Peak grad: 6.3 mmHg
Ao pk vel: 1.25 m/s
Area-P 1/2: 3.77 cm2
MV VTI: 2.91 cm2
S' Lateral: 2.9 cm

## 2022-11-28 LAB — BASIC METABOLIC PANEL
Anion gap: 6 (ref 5–15)
BUN: 13 mg/dL (ref 8–23)
CO2: 26 mmol/L (ref 22–32)
Calcium: 8.5 mg/dL — ABNORMAL LOW (ref 8.9–10.3)
Chloride: 105 mmol/L (ref 98–111)
Creatinine, Ser: 1.01 mg/dL — ABNORMAL HIGH (ref 0.44–1.00)
GFR, Estimated: 54 mL/min — ABNORMAL LOW (ref >60)
Glucose, Bld: 109 mg/dL — ABNORMAL HIGH (ref 70–99)
Potassium: 3.6 mmol/L (ref 3.5–5.1)
Sodium: 137 mmol/L (ref 135–145)

## 2022-11-28 NOTE — Evaluation (Signed)
Physical Therapy Evaluation Patient Details Name: Shannon Hendrix MRN: [id number] DOB: [birth year] Today's Date: 11/28/2022  History of Present Illness  89 y/o female presented to ED on 11/27/22 for SOB, weakness, fall (4/22), and dizziness. PMH: Afib, depression, osteoporosis, HTN, CKD stage IIIa  Clinical Impression  Patient admitted with the above. PTA, patient lives alone and has recently been using RW for mobility due to dizziness as well as rib and R hip pain from a fall. Daughter present during session and able to provide intermittent assistance at discharge. Patient presents with weakness, impaired balance, and decreased activity tolerance. Patient overall functioning at supervision level for OOB mobility with use of RW. Ambulated to/from bathroom with RW and denies dizziness, however BP reflecting drop with prolonged mobility, see below. Patient will benefit from skilled PT services during acute stay to address listed deficits. Patient will benefit from ongoing therapy at discharge to maximize functional independence and safety.   Orthostatic BPs  Supine 141/79  Sitting 120/75  Standing 112/61  After ~5 mins of mobility  93/67        Recommendations for follow up therapy are one component of a multi-disciplinary discharge planning process, led by the attending physician.  Recommendations may be updated based on patient status, additional functional criteria and insurance authorization.  Follow Up Recommendations       Assistance Recommended at Discharge PRN  Patient can return home with the following  A little help with walking and/or transfers;Assist for transportation;Help with stairs or ramp for entrance    Equipment Recommendations None recommended by PT  Recommendations for Other Services       Functional Status Assessment Patient has had a recent decline in their functional status and demonstrates the ability to make significant improvements in function in a reasonable  and predictable amount of time.     Precautions / Restrictions Precautions Precautions: Fall Precaution Comments: watch BP Restrictions Weight Bearing Restrictions: No      Mobility  Bed Mobility Overal bed mobility: Modified Independent             General bed mobility comments: HOB elevated    Transfers Overall transfer level: Needs assistance Equipment used: Rolling Aneita Kiger (2 wheels) Transfers: Sit to/from Stand Sit to Stand: Supervision           General transfer comment: supervision for safety    Ambulation/Gait Ambulation/Gait assistance: Supervision Gait Distance (Feet): 30 Feet (+30') Assistive device: Rolling Kyana Aicher (2 wheels) Gait Pattern/deviations: Step-through pattern, Decreased stride length Gait velocity: decreased     General Gait Details: supervision for safety. Denies dizziness however BP reflected drop, see general comments  Stairs            Wheelchair Mobility    Modified Rankin (Stroke Patients Only)       Balance Overall balance assessment: History of Falls, Needs assistance Sitting-balance support: No upper extremity supported, Feet supported Sitting balance-Leahy Scale: Good     Standing balance support: Bilateral upper extremity supported, Reliant on assistive device for balance Standing balance-Leahy Scale: Fair                               Pertinent Vitals/Pain Pain Assessment Pain Assessment: Faces Faces Pain Scale: Hurts little more Pain Location: abdomen/ribs Pain Descriptors / Indicators: Sore, Spasm Pain Intervention(s): Monitored during session, Repositioned    Home Living Family/patient expects to be discharged to:: Private residence Living Arrangements: Alone Available Help at Discharge:  Family Type of Home: House Home Access: Level entry       Home Layout: One level Home Equipment: Agricultural consultant (2 wheels);Cane - single point      Prior Function Prior Level of Function :  Independent/Modified Independent;Driving             Mobility Comments: recently began ambulating with RW 2/2 fall and dizziness       Hand Dominance        Extremity/Trunk Assessment   Upper Extremity Assessment Upper Extremity Assessment: Defer to OT evaluation    Lower Extremity Assessment Lower Extremity Assessment: Generalized weakness    Cervical / Trunk Assessment Cervical / Trunk Assessment: Normal  Communication   Communication: No difficulties  Cognition Arousal/Alertness: Awake/alert Behavior During Therapy: WFL for tasks assessed/performed Overall Cognitive Status: Within Functional Limits for tasks assessed                                          General Comments General comments (skin integrity, edema, etc.): Orthostatic BP - supine: 141/79, sitting:120/75, standing:  112/61, after 5 minutes of mobility: 93/67    Exercises     Assessment/Plan    PT Assessment Patient needs continued PT services  PT Problem List Decreased strength;Decreased activity tolerance;Decreased balance;Decreased mobility;Cardiopulmonary status limiting activity       PT Treatment Interventions DME instruction;Gait training;Functional mobility training;Therapeutic activities;Therapeutic exercise;Balance training;Patient/family education    PT Goals (Current goals can be found in the Care Plan section)  Acute Rehab PT Goals Patient Stated Goal: to reduce soreness and improve mobility PT Goal Formulation: With patient Time For Goal Achievement: 12/12/22 Potential to Achieve Goals: Good    Frequency Min 3X/week     Co-evaluation               AM-PAC PT "6 Clicks" Mobility  Outcome Measure Help needed turning from your back to your side while in a flat bed without using bedrails?: None Help needed moving from lying on your back to sitting on the side of a flat bed without using bedrails?: None Help needed moving to and from a bed to a chair  (including a wheelchair)?: A Little Help needed standing up from a chair using your arms (e.g., wheelchair or bedside chair)?: A Little Help needed to walk in hospital room?: A Little Help needed climbing 3-5 steps with a railing? : A Little 6 Click Score: 20    End of Session   Activity Tolerance: Patient tolerated treatment well Patient left: in bed;with call bell/phone within reach;with family/visitor present Nurse Communication: Mobility status PT Visit Diagnosis: Unsteadiness on feet (R26.81);Muscle weakness (generalized) (M62.81)    Time: 6045-4098 PT Time Calculation (min) (ACUTE ONLY): 20 min   Charges:   PT Evaluation $PT Eval Low Complexity: 1 Low          Maylon Peppers, PT, DPT Physical Therapist - [place]  [facility]   Irem Stoneham A Heylee Tant 11/28/2022, 11:25 AM

## 2022-11-28 NOTE — Care Management Obs Status (Signed)
MEDICARE OBSERVATION STATUS NOTIFICATION   Patient Details  Name: Shannon Hendrix MRN: [id number] Date of Birth: 10/10/1933   Medicare Observation Status Notification Given:  Yes    LAVERE SHINSKY, LCSW 11/28/2022, 1:38 PM

## 2022-11-28 NOTE — TOC Transition Note (Signed)
Transition of Care [facility]) - CM/SW Discharge Note   Patient Details  Name: Shannon Hendrix MRN: [id number] Date of Birth: 1934-04-06  Transition of Care [facility] CM/SW Contact:  Margarito Liner, LCSW Phone Number: 11/28/2022, 1:38 PM   Clinical Narrative:   Patient has orders to discharge home today. CSW met with patient. No supports at bedside. CSW introduced role and explained that PT recommendations would be discussed. Patient is agreeable to home health PT. Reviewed CMS scores for agencies that serve this area. No preference. [facility] has accepted referral and can start tomorrow. Patient is aware and agreeable. Patient declined 3-in-1. No further concerns. CSW signing off.  Final next level of care: Home w Home Health Services Barriers to Discharge: No Barriers Identified   Patient Goals and CMS Choice CMS Medicare.gov Compare Post Acute Care list provided to:: Patient    Discharge Placement                      Patient and family notified of of transfer: 11/28/22  Discharge Plan and Services Additional resources added to the After Visit Summary for                            [facility] Arranged: PT [facility] Agency: [employer] [facility] Services Date [facility] Agency Contacted: 11/28/22   Representative spoke with at [facility] Agency: Becky Sax  Social Determinants of Health (SDOH) Interventions SDOH Screenings   Tobacco Use: Medium Risk (11/27/2022)     Readmission Risk Interventions     No data to display

## 2022-11-28 NOTE — Progress Notes (Signed)
*  PRELIMINARY RESULTS* Echocardiogram 2D Echocardiogram has been performed.  [name] 11/28/2022, 10:16 AM

## 2022-11-28 NOTE — ED Notes (Signed)
Helped pt on bedpan. 

## 2022-11-28 NOTE — Evaluation (Signed)
Occupational Therapy Evaluation Patient Details Name: Shannon Hendrix MRN: [id number] DOB: 1933/08/30 Today's Date: 11/28/2022   History of Present Illness 89 y/o female presented to ED on 11/27/22 for SOB, weakness, fall (4/22), and dizziness. PMH: Afib, depression, osteoporosis, HTN, CKD stage IIIa   Clinical Impression   Patient presenting with decreased Ind in self care,balance, functional mobility/tranfers, endurance, and safety awareness. Patient reports living at home alone and being very Ind at baseline. She drives and enjoys going to yard sales on the weekends. Orthostatic BP's checked during session and placed into flowsheets as well as reported to RN and MD. Pt remains asymptomatic but has significant drops with transitions and the longer she is in standing. Pt ambulates with RW to bathroom and performed clothing management, hygiene, and toilet transfer with supervisoin overall. She is pleasant and motivated for therapeutic intervention. Patient will benefit from acute OT to increase overall independence in the areas of ADLs, functional mobility, and safety awareness in order to safely discharge.      Recommendations for follow up therapy are one component of a multi-disciplinary discharge planning process, led by the attending physician.  Recommendations may be updated based on patient status, additional functional criteria and insurance authorization.   Assistance Recommended at Discharge Intermittent Supervision/Assistance  Patient can return home with the following A little help with bathing/dressing/bathroom;Assistance with cooking/housework;Assist for transportation;Help with stairs or ramp for entrance    Functional Status Assessment  Patient has had a recent decline in their functional status and demonstrates the ability to make significant improvements in function in a reasonable and predictable amount of time.  Equipment Recommendations  BSC/3in1       Precautions /  Restrictions Precautions Precautions: Fall Precaution Comments: watch BP Restrictions Weight Bearing Restrictions: No      Mobility Bed Mobility Overal bed mobility: Modified Independent             General bed mobility comments: HOB elevated    Transfers Overall transfer level: Needs assistance Equipment used: Rolling walker (2 wheels) Transfers: Sit to/from Stand Sit to Stand: Supervision           General transfer comment: supervision for safety      Balance Overall balance assessment: History of Falls, Needs assistance Sitting-balance support: No upper extremity supported, Feet supported Sitting balance-Leahy Scale: Good     Standing balance support: Bilateral upper extremity supported, Reliant on assistive device for balance Standing balance-Leahy Scale: Fair                             ADL either performed or assessed with clinical judgement   ADL Overall ADL's : Needs assistance/impaired     Grooming: Wash/dry hands;Standing;Supervision/safety                   Toilet Transfer: Supervision/safety;Rolling walker (2 wheels)   Toileting- Clothing Manipulation and Hygiene: Supervision/safety;Sit to/from stand       Functional mobility during ADLs: Supervision/safety       Vision Patient Visual Report: No change from baseline              Pertinent Vitals/Pain Pain Assessment Pain Assessment: Faces Faces Pain Scale: Hurts little more Pain Location: abdomen/ribs Pain Descriptors / Indicators: Sore, Spasm Pain Intervention(s): Monitored during session, Repositioned     Hand Dominance Right   Extremity/Trunk Assessment Upper Extremity Assessment Upper Extremity Assessment: Generalized weakness   Lower Extremity Assessment Lower Extremity Assessment: Generalized weakness  Cervical / Trunk Assessment Cervical / Trunk Assessment: Normal   Communication Communication Communication: No difficulties   Cognition  Arousal/Alertness: Awake/alert Behavior During Therapy: WFL for tasks assessed/performed Overall Cognitive Status: Within Functional Limits for tasks assessed                                       General Comments  Orthostatic BP - supine: 141/79, sitting:120/75, standing:  112/61, after 5 minutes of mobility: 93/67            Home Living Family/patient expects to be discharged to:: Private residence Living Arrangements: Alone Available Help at Discharge: Family Type of Home: House Home Access: Level entry     Home Layout: One level     Bathroom Shower/Tub: Walk-in shower         Home Equipment: Agricultural consultant (2 wheels);Cane - single point          Prior Functioning/Environment Prior Level of Function : Independent/Modified Independent;Driving             Mobility Comments: recently began ambulating with RW 2/2 fall and dizziness ADLs Comments: Pt reports being Ind with all IADLs and enjoys going to yard sales every weekend.        OT Problem List: Decreased strength;Decreased activity tolerance;Decreased safety awareness;Impaired balance (sitting and/or standing);Decreased knowledge of use of DME or AE      OT Treatment/Interventions: Self-care/ADL training;Therapeutic exercise;DME and/or AE instruction;Patient/family education;Balance training;Therapeutic activities;Energy conservation    OT Goals(Current goals can be found in the care plan section) Acute Rehab OT Goals Patient Stated Goal: to go home OT Goal Formulation: With patient Time For Goal Achievement: 12/12/22 Potential to Achieve Goals: Good ADL Goals Pt Will Perform Grooming: (P) with modified independence;standing Pt Will Perform Lower Body Dressing: (P) with modified independence;sit to/from stand Pt Will Transfer to Toilet: (P) with modified independence;ambulating Pt Will Perform Toileting - Clothing Manipulation and hygiene: (P) with modified independence;sit to/from  stand  OT Frequency: Min 2X/week       AM-PAC OT "6 Clicks" Daily Activity     Outcome Measure Help from another person eating meals?: None Help from another person taking care of personal grooming?: None Help from another person toileting, which includes using toliet, bedpan, or urinal?: None Help from another person bathing (including washing, rinsing, drying)?: None Help from another person to put on and taking off regular upper body clothing?: None Help from another person to put on and taking off regular lower body clothing?: None 6 Click Score: 24   End of Session Equipment Utilized During Treatment: Rolling walker (2 wheels) Nurse Communication: Mobility status;Other (comment) (orthostatic hypotension)  Activity Tolerance: Patient tolerated treatment well Patient left: in bed;with call bell/phone within reach;with family/visitor present  OT Visit Diagnosis: Unsteadiness on feet (R26.81);Repeated falls (R29.6);Muscle weakness (generalized) (M62.81)                Time: 1610-9604 OT Time Calculation (min): 20 min Charges:  OT General Charges $OT Visit: 1 Visit OT Evaluation $OT Eval Low Complexity: 1 Low  Jackquline Denmark, MS, OTR/L , CBIS ascom [phone]  11/28/22, 12:50 PM

## 2022-11-28 NOTE — Discharge Summary (Signed)
Physician Discharge Summary  Shannon Hendrix ZOX:[id number] DOB: 1933/08/20 DOA: 11/27/2022  PCP: Lynnea Ferrier, MD  Admit date: 11/27/2022 Discharge date: 11/28/2022  Admitted From: Home Disposition:  Home with home health  Recommendations for Outpatient Follow-up:  Follow up with PCP in 1-2 weeks   Home Health:Yes PT Equipment/Devices:None   Discharge Condition:Stable  CODE STATUS:FULL  Diet recommendation: Heart  Brief/Interim Summary: 89 year old female with history of paroxysmal atrial fibrillation on Eliquis, hypertension, GERD, hyperlipidemia, depression with anxiety, osteoporosis, CKD stage IIIa, who presents to the emergency department for chief concerns of weakness, shortness of breath.   UA positive for large leukocytes but no other clinical indications of infection.  Antibiotics discontinued.  Orthostatics positive.  Patient did reasonably well with physical therapy.  Echocardiogram repeated and reassuring.  Normal ejection fraction with mild diastolic dysfunction.  Patient will have event monitor placed prior to discharge.  Appreciate cardiology assistance.  At time of discharge I discussed with pharmacy the patient's medications.  Patient is chronically on a medication for migraine prophylaxis, nortriptyline.  TCAs can be associated with cardiac conduction abnormalities as well as predispose patients to orthostatic hypotension.  Had a lengthy discussion with the patient regarding this medication.  She stated she has been on this for several years.  At this time I am uncomfortable to discontinue this medication suddenly.  I feel this warrants further discussion with primary care physician to determine whether a taper is needed or there is another medication that the patient can be switched to.  Patient expressed understanding and agreed to see her PCP ASAP to discuss the ongoing risk/benefit nortriptyline given her presentation and consistent with orthostatic hypotension.  She  is anticoagulated and would be at increased risk for bleed with recurrent falls.    Discharge Diagnoses:  Principal Problem:   Shortness of breath Active Problems:   Dyslipidemia   Osteoporosis, post-menopausal   OA (osteoarthritis) of hip   GERD (gastroesophageal reflux disease)   Hyperlipidemia, unspecified   Depression   Paroxysmal atrial fibrillation (HCC)   Traumatic ecchymosis of left lower leg  Weakness, dizziness, falls Likely related to orthostatic hypotension.  Patient given strategies to avoid symptomatic orthostatic drops.  Recommend follow-up with primary care physician to discuss possibly switching nortriptyline as this medication may contribute to cardiac conduction abnormalities as well as predispose to orthostatic hypotension.  30-day event monitor placed by cardiology service prior to discharge.  Discharge Instructions  Discharge Instructions     Diet - low sodium heart healthy   Complete by: As directed    Increase activity slowly   Complete by: As directed       Allergies as of 11/28/2022       Reactions   Rosuvastatin Other (See Comments)   Niacin Rash   Nitrofurantoin Rash        Medication List     STOP taking these medications    rosuvastatin 10 MG tablet Commonly known as: CRESTOR   sertraline 25 MG tablet Commonly known as: ZOLOFT       TAKE these medications    acetaminophen 500 MG tablet Commonly known as: TYLENOL Take 1,000 mg by mouth every 6 (six) hours as needed (for pain.).   amiodarone 100 MG tablet Commonly known as: PACERONE Take 100 mg by mouth daily.   amiodarone 200 MG tablet Commonly known as: PACERONE Take 1 tablet (200 mg total) by mouth daily.   apixaban 5 MG Tabs tablet Commonly known as: ELIQUIS Take 1 tablet (  5 mg total) by mouth 2 (two) times daily.   cyanocobalamin 1000 MCG tablet Commonly known as: VITAMIN B12 Take 1,000 mcg by mouth daily.   Fish Oil 1000 MG Caps Take 2 capsules by mouth 2  (two) times daily.   lovastatin 20 MG tablet Commonly known as: MEVACOR Take 20 mg by mouth daily.   metoprolol succinate 50 MG 24 hr tablet Commonly known as: TOPROL-XL Take 1 tablet (50 mg total) by mouth daily. Take with or immediately following a meal. What changed: additional instructions   multivitamin with minerals Tabs tablet Take 1 tablet by mouth daily.   nortriptyline 25 MG capsule Commonly known as: PAMELOR Take 25 mg by mouth at bedtime.   omeprazole 20 MG capsule Commonly known as: PRILOSEC Take 20 mg by mouth daily.   Vitamin D-3 125 MCG (5000 UT) Tabs Take 5,000 Units by mouth daily.        Follow-up Information     Alwyn Pea, MD. Go in 3 week(s).   Specialties: Cardiology, Internal Medicine Why: follow up heart monitor Contact information: [address] [place] [address] [phone]         [facility] Follow up.   Why: They will follow up with you for your home health physical therapy needs. Contact information: [address] [place] [address] [phone]                Allergies  Allergen Reactions   Rosuvastatin Other (See Comments)   Niacin Rash   Nitrofurantoin Rash    Consultations: None   Procedures/Studies: ECHOCARDIOGRAM COMPLETE  Result Date: 11/28/2022    ECHOCARDIOGRAM REPORT   Patient Name:   Shannon Hendrix Date of Exam: 11/28/2022 Medical Rec #:  [id number]        Height:       65.0 in Accession #:    [id number]       Weight:       170.0 lb Date of Birth:  [birth year]       BSA:          1.846 m Patient Age:    88 years         BP:           164/80 mmHg Patient Gender: F                HR:           93 bpm. Exam Location:  [facility] Procedure: 2D Echo, Cardiac Doppler and Color Doppler Indications:     Syncope  History:         Patient has prior history of Echocardiogram examinations, most                  recent 11/17/2021. Arrythmias:Atrial Fibrillation,                   Signs/Symptoms:Syncope and Shortness of Breath; Risk                  Factors:Dyslipidemia.  Sonographer:     Mikki Harbor Referring Phys:  [id number] AMY N COX Diagnosing Phys: Julien Nordmann MD IMPRESSIONS  1. Left ventricular ejection fraction, by estimation, is 60 to 65%. The left ventricle has normal function. The left ventricle has no regional wall motion abnormalities. Left ventricular diastolic parameters are consistent with Grade I diastolic dysfunction (impaired relaxation).  2. Right ventricular systolic function is normal. The right ventricular size is normal. There is  mildly elevated pulmonary artery systolic pressure. The estimated right ventricular systolic pressure is 41.4 mmHg.  3. The mitral valve is normal in structure. Mild mitral valve regurgitation. No evidence of mitral stenosis.  4. Tricuspid valve regurgitation is moderate.  5. The aortic valve is tricuspid. Aortic valve regurgitation is not visualized. No aortic stenosis is present.  6. The inferior vena cava is normal in size with greater than 50% respiratory variability, suggesting right atrial pressure of 3 mmHg. FINDINGS  Left Ventricle: Left ventricular ejection fraction, by estimation, is 60 to 65%. The left ventricle has normal function. The left ventricle has no regional wall motion abnormalities. The left ventricular internal cavity size was normal in size. There is  no left ventricular hypertrophy. Left ventricular diastolic parameters are consistent with Grade I diastolic dysfunction (impaired relaxation). Right Ventricle: The right ventricular size is normal. No increase in right ventricular wall thickness. Right ventricular systolic function is normal. There is mildly elevated pulmonary artery systolic pressure. The tricuspid regurgitant velocity is 2.89  m/s, and with an assumed right atrial pressure of 8 mmHg, the estimated right ventricular systolic pressure is 41.4 mmHg. Left Atrium: Left atrial size was normal in size.  Right Atrium: Right atrial size was normal in size. Pericardium: There is no evidence of pericardial effusion. Mitral Valve: The mitral valve is normal in structure. Mild mitral valve regurgitation. No evidence of mitral valve stenosis. MV peak gradient, 7.9 mmHg. The mean mitral valve gradient is 3.0 mmHg. Tricuspid Valve: The tricuspid valve is normal in structure. Tricuspid valve regurgitation is moderate . No evidence of tricuspid stenosis. Aortic Valve: The aortic valve is tricuspid. Aortic valve regurgitation is not visualized. No aortic stenosis is present. Aortic valve mean gradient measures 3.0 mmHg. Aortic valve peak gradient measures 6.2 mmHg. Aortic valve area, by VTI measures 2.69 cm. Pulmonic Valve: The pulmonic valve was normal in structure. Pulmonic valve regurgitation is not visualized. No evidence of pulmonic stenosis. Aorta: The aortic root is normal in size and structure. Venous: The inferior vena cava is normal in size with greater than 50% respiratory variability, suggesting right atrial pressure of 3 mmHg. IAS/Shunts: No atrial level shunt detected by color flow Doppler.  LEFT VENTRICLE PLAX 2D LVIDd:         4.10 cm   Diastology LVIDs:         2.90 cm   LV e' medial:    7.40 cm/s LV PW:         1.20 cm   LV E/e' medial:  11.0 LV IVS:        1.20 cm   LV e' lateral:   10.00 cm/s LVOT diam:     2.00 cm   LV E/e' lateral: 8.2 LV SV:         73 LV SV Index:   40 LVOT Area:     3.14 cm  RIGHT VENTRICLE RV Basal diam:  3.60 cm RV Mid diam:    3.00 cm RV S prime:     14.00 cm/s TAPSE (M-mode): 2.4 cm LEFT ATRIUM             Index        RIGHT ATRIUM           Index LA diam:        4.00 cm 2.17 cm/m   RA Area:     15.20 cm LA Vol (A2C):   43.9 ml 23.78 ml/m  RA Volume:   39.00 ml  21.13 ml/m LA Vol (A4C):   48.6 ml 26.33 ml/m LA Biplane Vol: 46.1 ml 24.97 ml/m  AORTIC VALVE                    PULMONIC VALVE AV Area (Vmax):    2.56 cm     PV Vmax:       1.01 m/s AV Area (Vmean):   2.49 cm      PV Peak grad:  4.1 mmHg AV Area (VTI):     2.69 cm AV Vmax:           125.00 cm/s AV Vmean:          83.900 cm/s AV VTI:            0.273 m AV Peak Grad:      6.2 mmHg AV Mean Grad:      3.0 mmHg LVOT Vmax:         101.90 cm/s LVOT Vmean:        66.550 cm/s LVOT VTI:          0.233 m LVOT/AV VTI ratio: 0.86  AORTA Ao Root diam: 3.20 cm MITRAL VALVE                TRICUSPID VALVE MV Area (PHT): 3.77 cm     TR Peak grad:   33.4 mmHg MV Area VTI:   2.91 cm     TR Vmax:        289.00 cm/s MV Peak grad:  7.9 mmHg MV Mean grad:  3.0 mmHg     SHUNTS MV Vmax:       1.40 m/s     Systemic VTI:  0.23 m MV Vmean:      75.0 cm/s    Systemic Diam: 2.00 cm MV Decel Time: 201 msec MV E velocity: 81.75 cm/s MV A velocity: 118.50 cm/s MV E/A ratio:  0.69 Julien Nordmann MD Electronically signed by Julien Nordmann MD Signature Date/Time: 11/28/2022/12:15:51 PM    Final    CT HEAD WO CONTRAST ( )  Result Date: 11/28/2022 CLINICAL DATA:  Head trauma, minor. EXAM: CT HEAD WITHOUT CONTRAST TECHNIQUE: Contiguous axial images were obtained from the base of the skull through the vertex without intravenous contrast. RADIATION DOSE REDUCTION: This exam was performed according to the departmental dose-optimization program which includes automated exposure control, adjustment of the mA and/or kV according to patient size and/or use of iterative reconstruction technique. COMPARISON:  Head CT 11/18/2022. FINDINGS: Brain: No acute hemorrhage. Unchanged mild chronic small-vessel disease. Cortical gray-white differentiation is otherwise preserved. Prominence of the ventricles and sulci within expected range for age. No hydrocephalus or extra-axial collection. No mass effect or midline shift. Vascular: No hyperdense vessel or unexpected calcification. Skull: No calvarial fracture or suspicious bone lesion. Skull base is unremarkable. Sinuses/Orbits: Unremarkable. Other: Slightly decreased size of the previously seen left vertex scalp hematoma.  IMPRESSION: No evidence of acute intracranial abnormality. Electronically Signed   By: Orvan Falconer M.D.   On: 11/28/2022 09:45   DG Chest Port 1 View  Result Date: 11/27/2022 CLINICAL DATA:  Shortness of breath EXAM: PORTABLE CHEST 1 VIEW COMPARISON:  CXR 11/18/22 FINDINGS: No pleural effusion. No pneumothorax. Hazy bibasilar airspace opacities are nonspecific and could represent atelectasis or infection. Normal cardiac and mediastinal contours. Chronic posterior right 5 through 6 rib fractures. IMPRESSION: Hazy bibasilar airspace opacities are nonspecific and could represent atelectasis or infection. Electronically Signed   By: Elige Radon.D.  On: 11/27/2022 13:18   DG Chest 2 View  Result Date: 11/18/2022 CLINICAL DATA:  Chest pain post fall, became dizzy getting out of bed and fell striking head on nightstand EXAM: CHEST - 2 VIEW COMPARISON:  11/16/2021 FINDINGS: Normal heart size, mediastinal contours, and pulmonary vascularity. Atherosclerotic calcification aorta. Chronic bronchitic changes. No pulmonary infiltrate, pleural effusion or pneumothorax. Osseous demineralization with multiple old RIGHT rib fractures. IMPRESSION: Chronic bronchitic changes without acute infiltrate. Aortic Atherosclerosis (ICD10-I70.0). Electronically Signed   By: Ulyses Southward M.D.   On: 11/18/2022 08:43   CT Head Wo Contrast  Result Date: 11/18/2022 CLINICAL DATA:  89 year old female status post fall 2-3 days ago. Dizziness. Struck head on night stand. Pain. EXAM: CT HEAD WITHOUT CONTRAST TECHNIQUE: Contiguous axial images were obtained from the base of the skull through the vertex without intravenous contrast. RADIATION DOSE REDUCTION: This exam was performed according to the departmental dose-optimization program which includes automated exposure control, adjustment of the mA and/or kV according to patient size and/or use of iterative reconstruction technique. COMPARISON:  Head CT 08/25/2018. FINDINGS: Brain:  Cerebral volume remains normal for age. No midline shift, ventriculomegaly, mass effect, evidence of mass lesion, intracranial hemorrhage or evidence of cortically based acute infarction. Mild for age periventricular white matter hypodensity. Vascular: Calcified atherosclerosis at the skull base. No suspicious intracranial vascular hyperdensity. Skull: Stable.  No acute osseous abnormality identified. Sinuses/Orbits: Visualized paranasal sinuses and mastoids are stable and well aerated. Other: Left vertex scalp hematoma is lobulated, up to 12 mm in thickness. Underlying calvarium intact. No other orbit or scalp soft tissue injury identified. IMPRESSION: 1. Left vertex scalp hematoma without underlying skull fracture. 2. No acute intracranial abnormality. Mild for age cerebral white matter changes. Electronically Signed   By: Odessa Fleming M.D.   On: 11/18/2022 08:00      Subjective: Seen and examined on the day of discharge.  Stable no distress.  Discharge instructions explained to patient in detail.  Discharge Exam: Vitals:   11/28/22 1400 11/28/22 1430  BP: (!) 144/76 (!) 155/69  Pulse: (!) 34 (!) 108  Resp: 20 19  Temp:    SpO2:  94%   Vitals:   11/28/22 1300 11/28/22 1330 11/28/22 1400 11/28/22 1430  BP: (!) 142/71 (!) 160/66 (!) 144/76 (!) 155/69  Pulse: (!) 43 72 (!) 34 (!) 108  Resp: (!) 21 (!) 23 20 19   Temp:      TempSrc:      SpO2:    94%    General: Pt is alert, awake, not in acute distress Cardiovascular: RRR, S1/S2 +, no rubs, no gallops Respiratory: CTA bilaterally, no wheezing, no rhonchi Abdominal: Soft, NT, ND, bowel sounds + Extremities: no edema, no cyanosis    The results of significant diagnostics from this hospitalization (including imaging, microbiology, ancillary and laboratory) are listed below for reference.     Microbiology: Recent Results (from the past 240 hour(s))  SARS Coronavirus 2 by RT PCR (hospital order, performed in [facility] hospital lab)  *cepheid single result test* Anterior Nasal Swab     Status: None   Collection Time: 11/27/22  9:31 AM   Specimen: Anterior Nasal Swab  Result Value Ref Range Status   SARS Coronavirus 2 by RT PCR NEGATIVE NEGATIVE Final    Comment: (NOTE) SARS-CoV-2 target nucleic acids are NOT DETECTED.  The SARS-CoV-2 RNA is generally detectable in upper and lower respiratory specimens during the acute phase of infection. The lowest concentration of SARS-CoV-2 viral copies  this assay can detect is 250 copies / mL. A negative result does not preclude SARS-CoV-2 infection and should not be used as the sole basis for treatment or other patient management decisions.  A negative result may occur with improper specimen collection / handling, submission of specimen other than nasopharyngeal swab, presence of viral mutation(s) within the areas targeted by this assay, and inadequate number of viral copies (<250 copies / mL). A negative result must be combined with clinical observations, patient history, and epidemiological information.  Fact Sheet for Patients:   RoadLapTop.co.za  Fact Sheet for Healthcare Providers: http://kim-miller.com/  This test is not yet approved or  cleared by the [place] FDA and has been authorized for detection and/or diagnosis of SARS-CoV-2 by FDA under an Emergency Use Authorization (EUA).  This EUA will remain in effect (meaning this test can be used) for the duration of the COVID-19 declaration under Section 564(b)(1) of the Act, 21 U.S.C. section 360bbb-3(b)(1), unless the authorization is terminated or revoked sooner.  Performed at [facility], [address] [place], [place] [address]      Labs: BNP (last 3 results) No results for input(s): "BNP" in the last 8760 hours. Basic Metabolic Panel: Recent Labs  Lab 11/27/22 0931 11/28/22 0500  NA 137 137  K 3.8 3.6  CL 103 105  CO2 22 26  GLUCOSE  115* 109*  BUN 16 13  CREATININE 1.09* 1.01*  CALCIUM 9.2 8.5*   Liver Function Tests: Recent Labs  Lab 11/27/22 0931  AST 36  ALT 33  ALKPHOS 90  BILITOT 1.2  PROT 7.5  ALBUMIN 3.8   No results for input(s): "LIPASE", "AMYLASE" in the last 168 hours. No results for input(s): "AMMONIA" in the last 168 hours. CBC: Recent Labs  Lab 11/27/22 0931 11/28/22 0500  WBC 8.9 9.0  HGB 14.3 12.3  HCT 42.9 37.6  MCV 92.7 93.8  PLT 295 260   Cardiac Enzymes: Recent Labs  Lab 11/27/22 2010  CKTOTAL 53   BNP: Invalid input(s): "POCBNP" CBG: No results for input(s): "GLUCAP" in the last 168 hours. D-Dimer No results for input(s): "DDIMER" in the last 72 hours. Hgb A1c No results for input(s): "HGBA1C" in the last 72 hours. Lipid Profile No results for input(s): "CHOL", "HDL", "LDLCALC", "TRIG", "CHOLHDL", "LDLDIRECT" in the last 72 hours. Thyroid function studies No results for input(s): "TSH", "T4TOTAL", "T3FREE", "THYROIDAB" in the last 72 hours.  Invalid input(s): "FREET3" Anemia work up No results for input(s): "VITAMINB12", "FOLATE", "FERRITIN", "TIBC", "IRON", "RETICCTPCT" in the last 72 hours. Urinalysis    Component Value Date/Time   COLORURINE YELLOW (A) 11/27/2022 1013   APPEARANCEUR CLEAR (A) 11/27/2022 1013   LABSPEC 1.005 11/27/2022 1013   PHURINE 7.0 11/27/2022 1013   GLUCOSEU NEGATIVE 11/27/2022 1013   HGBUR NEGATIVE 11/27/2022 1013   BILIRUBINUR NEGATIVE 11/27/2022 1013   KETONESUR NEGATIVE 11/27/2022 1013   PROTEINUR NEGATIVE 11/27/2022 1013   NITRITE NEGATIVE 11/27/2022 1013   LEUKOCYTESUR LARGE (A) 11/27/2022 1013   Sepsis Labs Recent Labs  Lab 11/27/22 0931 11/28/22 0500  WBC 8.9 9.0   Microbiology Recent Results (from the past 240 hour(s))  SARS Coronavirus 2 by RT PCR (hospital order, performed in [facility] hospital lab) *cepheid single result test* Anterior Nasal Swab     Status: None   Collection Time: 11/27/22  9:31 AM    Specimen: Anterior Nasal Swab  Result Value Ref Range Status   SARS Coronavirus 2 by RT PCR NEGATIVE NEGATIVE  Final    Comment: (NOTE) SARS-CoV-2 target nucleic acids are NOT DETECTED.  The SARS-CoV-2 RNA is generally detectable in upper and lower respiratory specimens during the acute phase of infection. The lowest concentration of SARS-CoV-2 viral copies this assay can detect is 250 copies / mL. A negative result does not preclude SARS-CoV-2 infection and should not be used as the sole basis for treatment or other patient management decisions.  A negative result may occur with improper specimen collection / handling, submission of specimen other than nasopharyngeal swab, presence of viral mutation(s) within the areas targeted by this assay, and inadequate number of viral copies (<250 copies / mL). A negative result must be combined with clinical observations, patient history, and epidemiological information.  Fact Sheet for Patients:   RoadLapTop.co.za  Fact Sheet for Healthcare Providers: http://kim-miller.com/  This test is not yet approved or  cleared by the [place] FDA and has been authorized for detection and/or diagnosis of SARS-CoV-2 by FDA under an Emergency Use Authorization (EUA).  This EUA will remain in effect (meaning this test can be used) for the duration of the COVID-19 declaration under Section 564(b)(1) of the Act, 21 U.S.C. section 360bbb-3(b)(1), unless the authorization is terminated or revoked sooner.  Performed at [facility], [address], [place], [place] [address]      Time coordinating discharge: Over 30 minutes  SIGNED:   Tresa Moore, MD  [facility] 11/28/2022, 3:24 PM Pager   If 7PM-7AM, please contact night-coverage

## 2023-05-12 ENCOUNTER — Other Ambulatory Visit: Payer: Self-pay | Admitting: Internal Medicine

## 2023-05-12 DIAGNOSIS — Z1231 Encounter for screening mammogram for malignant neoplasm of breast: Secondary | ICD-10-CM

## 2023-06-03 ENCOUNTER — Ambulatory Visit
Admission: RE | Admit: 2023-06-03 | Discharge: 2023-06-03 | Disposition: A | Discharge status: Home / Self Care | Payer: Medicare Other | Source: Ambulatory Visit | Attending: Internal Medicine | Admitting: Internal Medicine

## 2023-06-03 DIAGNOSIS — Z1231 Encounter for screening mammogram for malignant neoplasm of breast: Secondary | ICD-10-CM | POA: Diagnosis present

## 2024-04-20 ENCOUNTER — Ambulatory Visit (INDEPENDENT_AMBULATORY_CARE_PROVIDER_SITE_OTHER): Admitting: Nurse Practitioner

## 2024-04-20 ENCOUNTER — Encounter (INDEPENDENT_AMBULATORY_CARE_PROVIDER_SITE_OTHER): Payer: Self-pay | Admitting: Nurse Practitioner

## 2024-04-20 VITALS — BP 145/86 | HR 86 | Wt 175.2 lb

## 2024-04-20 DIAGNOSIS — G458 Other transient cerebral ischemic attacks and related syndromes: Secondary | ICD-10-CM

## 2024-04-20 DIAGNOSIS — E785 Hyperlipidemia, unspecified: Secondary | ICD-10-CM

## 2024-04-20 NOTE — Progress Notes (Signed)
 Subjective:    Patient ID: Shannon Hendrix, female    DOB: April 07, 1934, 89 y.o.   MRN: [id number] Chief Complaint  Patient presents with   Establish Care    The patient is a 89 year old female who is referred by Dr. Florencio in regards to concern for subclavian steal syndrome.  The referral noted initial dizziness but the patient actually notes that she does not have any dizziness.  She notes that she has unbalanced and not necessarily steady.  She notes that she recently had her medications adjusted and that is actually improved some of these issues.  She also began to use a cane and that is also helped as well.  She denies dizziness with the use of her upper extremities.  She also denies any weakness or pain or numbness and tingling of her upper extremities.  She also denies dizziness when she tries to look up or elevates her arms.  Currently there are no open wounds or ulcerations.  Her hand is warm with palpable pulse.  Noninvasive studies did show that she has a likely stenosis of the right subclavian artery due to retrograde flow in the right vertebral artery as well as asymmetrical blood pressures.    Review of Systems  Musculoskeletal:  Positive for gait problem.  All other systems reviewed and are negative.      Objective:   Physical Exam Vitals reviewed.  HENT:     Head: Normocephalic.  Cardiovascular:     Rate and Rhythm: Normal rate.     Pulses:          Radial pulses are 2+ on the right side and 2+ on the left side.  Pulmonary:     Effort: Pulmonary effort is normal.  Skin:    General: Skin is warm and dry.  Neurological:     Mental Status: She is alert and oriented to person, place, and time.     Gait: Gait abnormal.  Psychiatric:        Mood and Affect: Mood normal.        Behavior: Behavior normal.        Thought Content: Thought content normal.        Judgment: Judgment normal.     BP (!) 145/86   Pulse 86   Wt 175 lb 4 oz (79.5 kg)   BMI 29.16 kg/m    Past Medical History:  Diagnosis Date   Arthritis    Cardiac arrhythmia    Dysrhythmia    Esophageal stricture    GERD (gastroesophageal reflux disease)    Hiatal hernia    HLD (hyperlipidemia)     Social History   Socioeconomic History   Marital status: Widowed    Spouse name: Not on file   Number of children: 1   Years of education: Not on file   Highest education level: Not on file  Occupational History   Occupation: retired    Associate Professor: [employer]  Tobacco Use   Smoking status: Former    Current packs/day: 0.00    Average packs/day: 0.3 packs/day for 5.0 years (1.5 ttl pk-yrs)    Types: Cigarettes    Start date: 07/29/1956    Quit date: 07/29/1961    Years since quitting: 62.7   Smokeless tobacco: Never  Vaping Use   Vaping status: Never Used  Substance and Sexual Activity   Alcohol use: No   Drug use: No   Sexual activity: Not Currently  Other Topics Concern  Not on file  Social History Narrative   Not on file   Social Drivers of Health   Financial Resource Strain: Low Risk  (04/19/2024)   Received from [facility]   Overall Financial Resource Strain (CARDIA)    Difficulty of Paying Living Expenses: Not hard at all  Food Insecurity: No Food Insecurity (04/19/2024)   Received from [facility]   Hunger Vital Sign    Within the past 12 months, you worried that your food would run out before you got the money to buy more.: Never true    Within the past 12 months, the food you bought just didn't last and you didn't have money to get more.: Never true  Transportation Needs: No Transportation Needs (04/19/2024)   Received from [facility] - Transportation    In the past 12 months, has lack of transportation kept you from medical appointments or from getting medications?: No    Lack of Transportation (Non-Medical): No  Physical Activity: Not on file  Stress: Not on file  Social Connections:  Not on file  Intimate Partner Violence: Not on file    Past Surgical History:  Procedure Laterality Date   ABDOMINAL HYSTERECTOMY     BREAST BIOPSY Left 04/18/14   BENIGN BREAST TISSUE WITH NODULAR FIBROSIS.    EYE SURGERY Bilateral    TOTAL HIP ARTHROPLASTY Right 09/24/2017   Procedure: TOTAL HIP ARTHROPLASTY;  Surgeon: Mardee Lynwood SQUIBB, MD;  Location: [facility] ORS;  Service: Orthopedics;  Laterality: Right;    Family History  Problem Relation Age of Onset   Bone cancer Sister    Heart attack Brother    Breast cancer Neg Hx     Allergies  Allergen Reactions   Rosuvastatin  Other (See Comments)   Niacin Rash   Nitrofurantoin Rash       Latest Ref Rng & Units 11/28/2022    5:00 AM 11/27/2022    9:31 AM 11/18/2022    8:26 AM  CBC  WBC 4.0 - 10.5 K/uL 9.0  8.9  6.7   Hemoglobin 12.0 - 15.0 g/dL 87.6  85.6  85.1   Hematocrit 36.0 - 46.0 % 37.6  42.9  45.8   Platelets 150 - 400 K/uL 260  295  229       CMP     Component Value Date/Time   NA 137 11/28/2022 0500   K 3.6 11/28/2022 0500   CL 105 11/28/2022 0500   CO2 26 11/28/2022 0500   GLUCOSE 109 (H) 11/28/2022 0500   BUN 13 11/28/2022 0500   CREATININE 1.01 (H) 11/28/2022 0500   CALCIUM  8.5 (L) 11/28/2022 0500   PROT 7.5 11/27/2022 0931   ALBUMIN 3.8 11/27/2022 0931   AST 36 11/27/2022 0931   ALT 33 11/27/2022 0931   ALKPHOS 90 11/27/2022 0931   BILITOT 1.2 11/27/2022 0931   GFRNONAA 54 (L) 11/28/2022 0500     No results found.     Assessment & Plan:   1. Subclavian steal syndrome (Primary) I had a long discussion with the patient and family.  Based upon her ultrasound results she likely does have right subclavian steal syndrome.  However her complaints are not necessarily about dizziness but rather being unbalanced.  She notes that she does not feel dizzy and her symptoms are not consistent with activity, elevating her arms or raising her head.  Her symptoms have also improved with use of a cane and with changes  to her medication.  Based on this I think she is actually asymptomatic from the standpoint of subclavian steal syndrome.  I had a discussion with the patient regarding conservative therapy medical management, for which she is on appropriate medical therapy.  With this option would maintain close follow-up initially of 3 months and potentially longer if she remains asymptomatic.  We also discussed invasive options including an angiogram.  Following discussion and risk benefits and alternatives the patient elects to move forward with medical management.  We will plan on seeing her back in 3 months time.  2. Hyperlipidemia, unspecified hyperlipidemia type Continue statin as ordered and reviewed, no changes at this time   Current Outpatient Medications on File Prior to Visit  Medication Sig Dispense Refill   acetaminophen  (TYLENOL ) 500 MG tablet Take 1,000 mg by mouth every 6 (six) hours as needed (for pain.).     amiodarone  (PACERONE ) 100 MG tablet Take 100 mg by mouth daily.     amiodarone  (PACERONE ) 200 MG tablet Take 1 tablet (200 mg total) by mouth daily. 30 tablet 0   apixaban  (ELIQUIS ) 5 MG TABS tablet Take 1 tablet (5 mg total) by mouth 2 (two) times daily. 60 tablet 0   Cholecalciferol  (VITAMIN D -3) 5000 units TABS Take 5,000 Units by mouth daily.     lovastatin (MEVACOR) 20 MG tablet Take 20 mg by mouth daily.     metoprolol  succinate (TOPROL -XL) 50 MG 24 hr tablet Take 1 tablet (50 mg total) by mouth daily. Take with or immediately following a meal. (Patient taking differently: Take 50 mg by mouth daily.) 30 tablet 0   Multiple Vitamin (MULTIVITAMIN WITH MINERALS) TABS tablet Take 1 tablet by mouth daily.     nortriptyline  (PAMELOR ) 25 MG capsule Take 25 mg by mouth at bedtime.     Omega-3 Fatty Acids (FISH OIL) 1000 MG CAPS Take 2 capsules by mouth 2 (two) times daily.      omeprazole (PRILOSEC) 20 MG capsule Take 20 mg by mouth daily.     vitamin B-12 (CYANOCOBALAMIN ) 1000 MCG tablet Take  1,000 mcg by mouth daily.     No current facility-administered medications on file prior to visit.    There are no Patient Instructions on file for this visit. No follow-ups on file.   Karrine Kluttz E Isaih Bulger, NP

## 2024-06-01 ENCOUNTER — Encounter: Payer: Self-pay | Admitting: Internal Medicine

## 2024-06-01 DIAGNOSIS — R7989 Other specified abnormal findings of blood chemistry: Secondary | ICD-10-CM

## 2024-06-10 ENCOUNTER — Other Ambulatory Visit: Payer: Self-pay | Admitting: Internal Medicine

## 2024-06-10 DIAGNOSIS — R7989 Other specified abnormal findings of blood chemistry: Secondary | ICD-10-CM

## 2024-06-17 ENCOUNTER — Other Ambulatory Visit: Payer: Self-pay | Admitting: Internal Medicine

## 2024-06-17 DIAGNOSIS — N1831 Chronic kidney disease, stage 3a: Secondary | ICD-10-CM

## 2024-06-17 DIAGNOSIS — R7989 Other specified abnormal findings of blood chemistry: Secondary | ICD-10-CM

## 2024-06-30 ENCOUNTER — Ambulatory Visit: Admission: RE | Admit: 2024-06-30 | Discharge: 2024-06-30 | Attending: Internal Medicine | Admitting: Internal Medicine

## 2024-06-30 DIAGNOSIS — R7989 Other specified abnormal findings of blood chemistry: Secondary | ICD-10-CM | POA: Diagnosis present

## 2024-06-30 DIAGNOSIS — N1831 Chronic kidney disease, stage 3a: Secondary | ICD-10-CM | POA: Insufficient documentation

## 2024-07-20 ENCOUNTER — Other Ambulatory Visit (INDEPENDENT_AMBULATORY_CARE_PROVIDER_SITE_OTHER): Payer: Self-pay | Admitting: Nurse Practitioner

## 2024-07-20 DIAGNOSIS — G458 Other transient cerebral ischemic attacks and related syndromes: Secondary | ICD-10-CM

## 2024-08-03 ENCOUNTER — Encounter (INDEPENDENT_AMBULATORY_CARE_PROVIDER_SITE_OTHER): Payer: Self-pay | Admitting: Nurse Practitioner

## 2024-08-03 ENCOUNTER — Ambulatory Visit (INDEPENDENT_AMBULATORY_CARE_PROVIDER_SITE_OTHER): Admitting: Nurse Practitioner

## 2024-08-03 ENCOUNTER — Ambulatory Visit (INDEPENDENT_AMBULATORY_CARE_PROVIDER_SITE_OTHER)

## 2024-08-03 VITALS — BP 143/83 | HR 85 | Resp 18 | Ht 65.0 in | Wt 177.2 lb

## 2024-08-03 DIAGNOSIS — G458 Other transient cerebral ischemic attacks and related syndromes: Secondary | ICD-10-CM

## 2024-08-03 DIAGNOSIS — E785 Hyperlipidemia, unspecified: Secondary | ICD-10-CM | POA: Diagnosis not present

## 2024-08-08 ENCOUNTER — Encounter (INDEPENDENT_AMBULATORY_CARE_PROVIDER_SITE_OTHER): Payer: Self-pay | Admitting: Nurse Practitioner

## 2024-08-08 NOTE — Progress Notes (Signed)
 "  Subjective:    Patient ID: Shannon Hendrix, female    DOB: 05-Apr-1934, 89 y.o.   MRN: [id number] Chief Complaint  Patient presents with   Follow-up    3 month + carotid     HPI  Discussed the use of AI scribe software for clinical note transcription with the patient, who gave verbal consent to proceed.  History of Present Illness Shannon Hendrix is an 89 year old female with subclavian steal syndrome who presents for vascular surgery follow-up.  She was initially referred for evaluation of subclavian steal syndrome. She has experienced intermittent episodes of unsteadiness and imbalance. She reports variable symptoms, with periods of improvement and overall stability. She reports improvement in balance and overall stability.  She denies pain, numbness, or paresthesia in the right upper extremity and has no difficulty with activities of daily living, including cooking. She has not experienced episodes of object dropping or loss of grip strength. Rare episodes of dizziness or lightheadedness occur with neck extension, but these have not increased in frequency or severity. She denies syncope or significant functional decline.  She reports coldness in her hands without associated numbness, tingling, color changes, or pain. No new or progressive symptoms have developed since her last evaluation.    Results 1 to 39% stenosis noted in the bilateral internal carotid arteries, with near normal velocities.  The right vertebral artery is retrograde with turbulent flow in the subclavian artery consistent with previous studies.    Review of Systems  Neurological:  Negative for numbness.  All other systems reviewed and are negative.      Objective:   Physical Exam Vitals reviewed.  HENT:     Head: Normocephalic.  Cardiovascular:     Rate and Rhythm: Normal rate.     Pulses:          Radial pulses are 1+ on the right side and 2+ on the left side.  Pulmonary:     Effort: Pulmonary  effort is normal.  Skin:    General: Skin is warm and dry.  Neurological:     Mental Status: She is alert and oriented to person, place, and time.  Psychiatric:        Mood and Affect: Mood normal.        Behavior: Behavior normal.        Thought Content: Thought content normal.        Judgment: Judgment normal.     Physical Exam CARDIOVASCULAR: No bruits auscultated. Carotid arteries with minimal stenosis, near normal. Left arm pulse 2+, right arm pulse 1+.  BP (!) 143/83 (BP Location: Left Arm, Patient Position: Sitting, Cuff Size: Normal)   Pulse 85   Resp 18   Ht 5' 5 (1.651 m)   Wt 177 lb 3.2 oz (80.4 kg)   BMI 29.49 kg/m   Past Medical History:  Diagnosis Date   Arthritis    Cardiac arrhythmia    Dysrhythmia    Esophageal stricture    GERD (gastroesophageal reflux disease)    Hiatal hernia    HLD (hyperlipidemia)     Social History   Socioeconomic History   Marital status: Widowed    Spouse name: Not on file   Number of children: 1   Years of education: Not on file   Highest education level: Not on file  Occupational History   Occupation: retired    Associate Professor: [employer]  Tobacco Use   Smoking status: Former    Current  packs/day: 0.00    Average packs/day: 0.3 packs/day for 5.0 years (1.5 ttl pk-yrs)    Types: Cigarettes    Start date: 07/29/1956    Quit date: 07/29/1961    Years since quitting: 63.0   Smokeless tobacco: Never  Vaping Use   Vaping status: Never Used  Substance and Sexual Activity   Alcohol use: No   Drug use: No   Sexual activity: Not Currently  Other Topics Concern   Not on file  Social History Narrative   Not on file   Social Drivers of Health   Tobacco Use: Medium Risk (08/08/2024)   Patient History    Smoking Tobacco Use: Former    Smokeless Tobacco Use: Never    Passive Exposure: Not on Actuary Strain: Low Risk  (06/16/2024)   Received from [facility]   Overall Financial  Resource Strain (CARDIA)    Difficulty of Paying Living Expenses: Not hard at all  Food Insecurity: No Food Insecurity (06/16/2024)   Received from [facility]   Epic    Within the past 12 months, you worried that your food would run out before you got the money to buy more.: Never true    Within the past 12 months, the food you bought just didn't last and you didn't have money to get more.: Never true  Transportation Needs: No Transportation Needs (06/16/2024)   Received from [facility] - Transportation    In the past 12 months, has lack of transportation kept you from medical appointments or from getting medications?: No    Lack of Transportation (Non-Medical): No  Physical Activity: Not on file  Stress: Not on file  Social Connections: Not on file  Intimate Partner Violence: Not on file  Depression (EYV7-0): Not on file  Alcohol Screen: Not on file  Housing: Low Risk  (06/16/2024)   Received from [facility]   Epic    In the last 12 months, was there a time when you were not able to pay the mortgage or rent on time?: No    In the past 12 months, how many times have you moved where you were living?: 0    At any time in the past 12 months, were you homeless or living in a shelter (including now)?: No  Utilities: Not At Risk (06/16/2024)   Received from [facility]   Epic    In the past 12 months has the electric, gas, oil, or water company threatened to shut off services in your home?: No  Health Literacy: Not on file    Past Surgical History:  Procedure Laterality Date   ABDOMINAL HYSTERECTOMY     BREAST BIOPSY Left 04/18/14   BENIGN BREAST TISSUE WITH NODULAR FIBROSIS.    EYE SURGERY Bilateral    TOTAL HIP ARTHROPLASTY Right 09/24/2017   Procedure: TOTAL HIP ARTHROPLASTY;  Surgeon: Mardee Lynwood SQUIBB, MD;  Location: [facility] ORS;  Service: Orthopedics;  Laterality: Right;    Family History  Problem  Relation Age of Onset   Bone cancer Sister    Heart attack Brother    Breast cancer Neg Hx     Allergies[1]     Latest Ref Rng & Units 11/28/2022    5:00 AM 11/27/2022    9:31 AM 11/18/2022    8:26 AM  CBC  WBC 4.0 - 10.5 K/uL 9.0  8.9  6.7   Hemoglobin 12.0 -  15.0 g/dL 87.6  85.6  85.1   Hematocrit 36.0 - 46.0 % 37.6  42.9  45.8   Platelets 150 - 400 K/uL 260  295  229       CMP     Component Value Date/Time   NA 137 11/28/2022 0500   K 3.6 11/28/2022 0500   CL 105 11/28/2022 0500   CO2 26 11/28/2022 0500   GLUCOSE 109 (H) 11/28/2022 0500   BUN 13 11/28/2022 0500   CREATININE 1.01 (H) 11/28/2022 0500   CALCIUM  8.5 (L) 11/28/2022 0500   PROT 7.5 11/27/2022 0931   ALBUMIN 3.8 11/27/2022 0931   AST 36 11/27/2022 0931   ALT 33 11/27/2022 0931   ALKPHOS 90 11/27/2022 0931   BILITOT 1.2 11/27/2022 0931   GFRNONAA 54 (L) 11/28/2022 0500     No results found.     Assessment & Plan:   1. Subclavian steal syndrome (Primary) Subclavian steal syndrome Subclavian steal syndrome with mild, infrequent symptoms. No symptom progression since prior visit. Minimal carotid artery stenosis present. - Extended follow-up interval to six months. - Instructed her to report any worsening symptoms, including increased dizziness, numbness, paresthesia, pain, color changes, or loss of function in the right arm. - Discussed outpatient intervention as an option for severe symptoms, typically performed as a day procedure without general anesthesia, which carries less risk than open surgery. - Provided reassurance regarding minimal carotid artery stenosis. - VAS US  CAROTID; Future   2. Hyperlipidemia, unspecified hyperlipidemia type Continue statin as ordered and reviewed, no changes at this time  Assessment and Plan Assessment & Plan      Medications Ordered Prior to Encounter[2]  There are no Patient Instructions on file for this visit. Return in about 6 months (around 01/31/2025)  for with Carotid; See Jd/ FB .   Cassady Stanczak E Dezmon Conover, NP      [1]  Allergies Allergen Reactions   Amiodarone  Swelling   Rosuvastatin  Other (See Comments)   Niacin Rash   Nitrofurantoin Rash  [2]  Current Outpatient Medications on File Prior to Visit  Medication Sig Dispense Refill   acetaminophen  (TYLENOL ) 500 MG tablet Take 1,000 mg by mouth every 6 (six) hours as needed (for pain.).     amiodarone  (PACERONE ) 100 MG tablet Take 100 mg by mouth daily.     amiodarone  (PACERONE ) 200 MG tablet Take 1 tablet (200 mg total) by mouth daily. 30 tablet 0   apixaban  (ELIQUIS ) 5 MG TABS tablet Take 1 tablet (5 mg total) by mouth 2 (two) times daily. 60 tablet 0   Cholecalciferol  (VITAMIN D -3) 5000 units TABS Take 5,000 Units by mouth daily.     lovastatin (MEVACOR) 20 MG tablet Take 20 mg by mouth daily.     metoprolol  succinate (TOPROL -XL) 50 MG 24 hr tablet Take 1 tablet (50 mg total) by mouth daily. Take with or immediately following a meal. (Patient taking differently: Take 50 mg by mouth daily.) 30 tablet 0   Multiple Vitamin (MULTIVITAMIN WITH MINERALS) TABS tablet Take 1 tablet by mouth daily.     nortriptyline  (PAMELOR ) 25 MG capsule Take 25 mg by mouth at bedtime.     Omega-3 Fatty Acids (FISH OIL) 1000 MG CAPS Take 2 capsules by mouth 2 (two) times daily.      omeprazole (PRILOSEC) 20 MG capsule Take 20 mg by mouth daily.     vitamin B-12 (CYANOCOBALAMIN ) 1000 MCG tablet Take 1,000 mcg by mouth daily.     No current  facility-administered medications on file prior to visit.   "

## 2025-02-02 ENCOUNTER — Ambulatory Visit (INDEPENDENT_AMBULATORY_CARE_PROVIDER_SITE_OTHER): Admitting: Nurse Practitioner

## 2025-02-02 ENCOUNTER — Encounter (INDEPENDENT_AMBULATORY_CARE_PROVIDER_SITE_OTHER)
